# Patient Record
Sex: Female | Born: 1980 | Race: White | Hispanic: No | Marital: Married | Smoking: Current every day smoker
Health system: Southern US, Community
[De-identification: ages and names within clinical notes are randomized; demographics above are authoritative.]

## PROBLEM LIST (undated history)

## (undated) ENCOUNTER — Emergency Department (HOSPITAL_COMMUNITY): Admission: EM | Payer: Self-pay | Source: Home / Self Care

## (undated) DIAGNOSIS — C801 Malignant (primary) neoplasm, unspecified: Secondary | ICD-10-CM

## (undated) DIAGNOSIS — F32A Depression, unspecified: Secondary | ICD-10-CM

## (undated) DIAGNOSIS — F329 Major depressive disorder, single episode, unspecified: Secondary | ICD-10-CM

## (undated) HISTORY — PX: TONSILLECTOMY: SUR1361

## (undated) HISTORY — PX: LIVER RESECTION: SHX1977

## (undated) HISTORY — PX: KNEE SURGERY: SHX244

## (undated) HISTORY — PX: CHOLECYSTECTOMY: SHX55

---

## 2006-01-06 ENCOUNTER — Emergency Department (HOSPITAL_COMMUNITY): Admission: EM | Admit: 2006-01-06 | Discharge: 2006-01-06 | Payer: Self-pay | Admitting: Emergency Medicine

## 2006-01-10 ENCOUNTER — Emergency Department (HOSPITAL_COMMUNITY): Admission: EM | Admit: 2006-01-10 | Discharge: 2006-01-10 | Payer: Self-pay | Admitting: Emergency Medicine

## 2006-03-01 ENCOUNTER — Emergency Department (HOSPITAL_COMMUNITY): Admission: EM | Admit: 2006-03-01 | Discharge: 2006-03-01 | Payer: Self-pay | Admitting: Emergency Medicine

## 2007-12-04 IMAGING — CR DG ELBOW COMPLETE 3+V*L*
4 series · 4 of 4 positions shown · non-contrast
Comparison: none

CLINICAL DATA: Left elbow pain for 2 days.  
 LEFT ELBOW ? 4 VIEW:

[x elbow joint ap left]
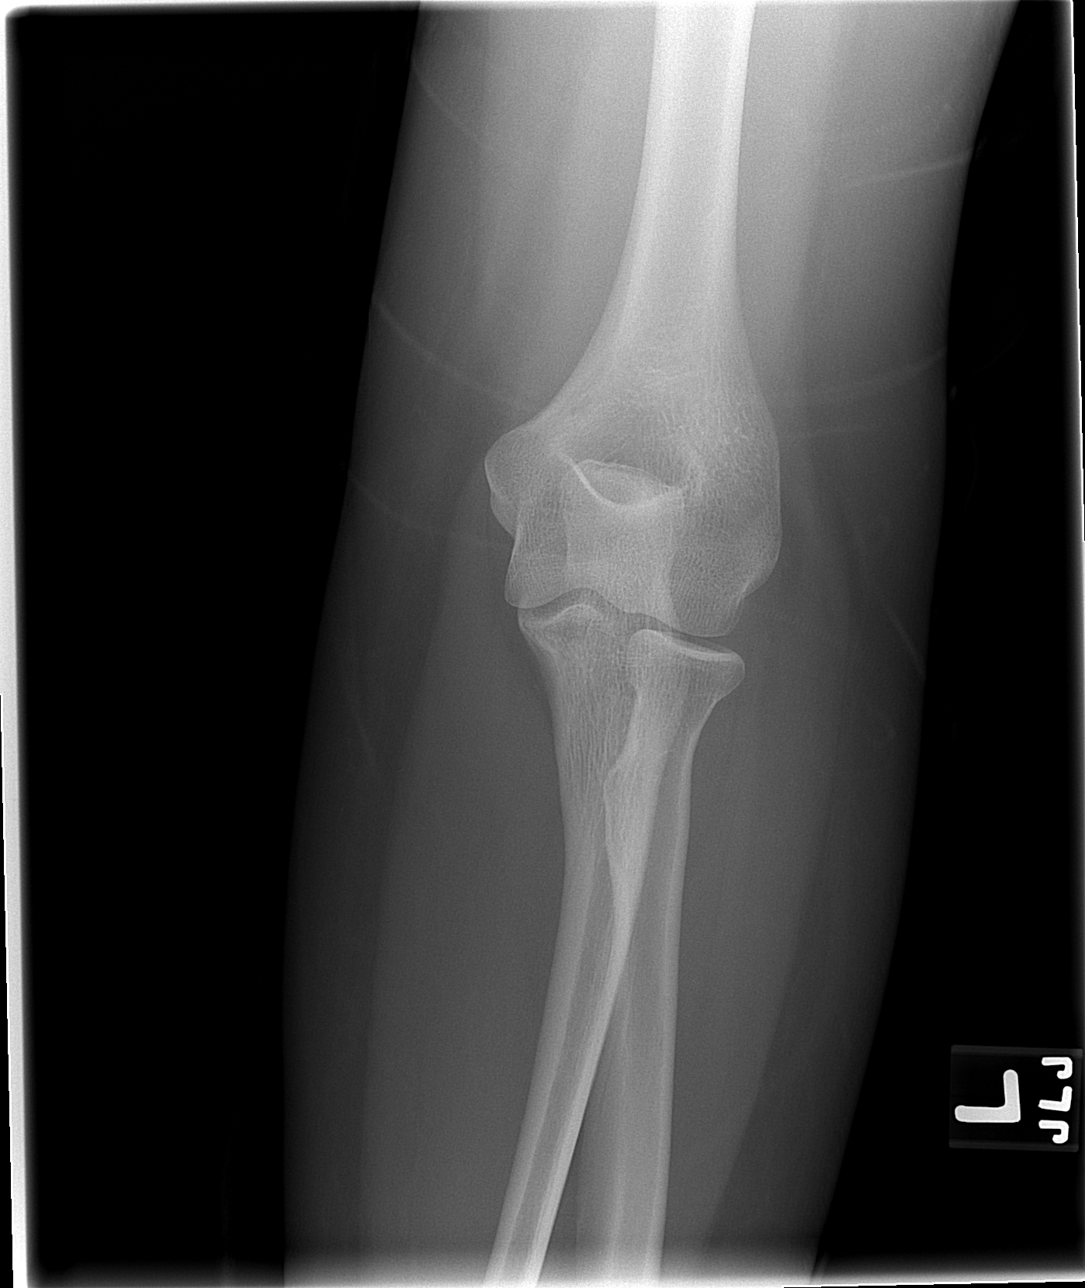

[x elbow joint obl. left (1 of 2)]
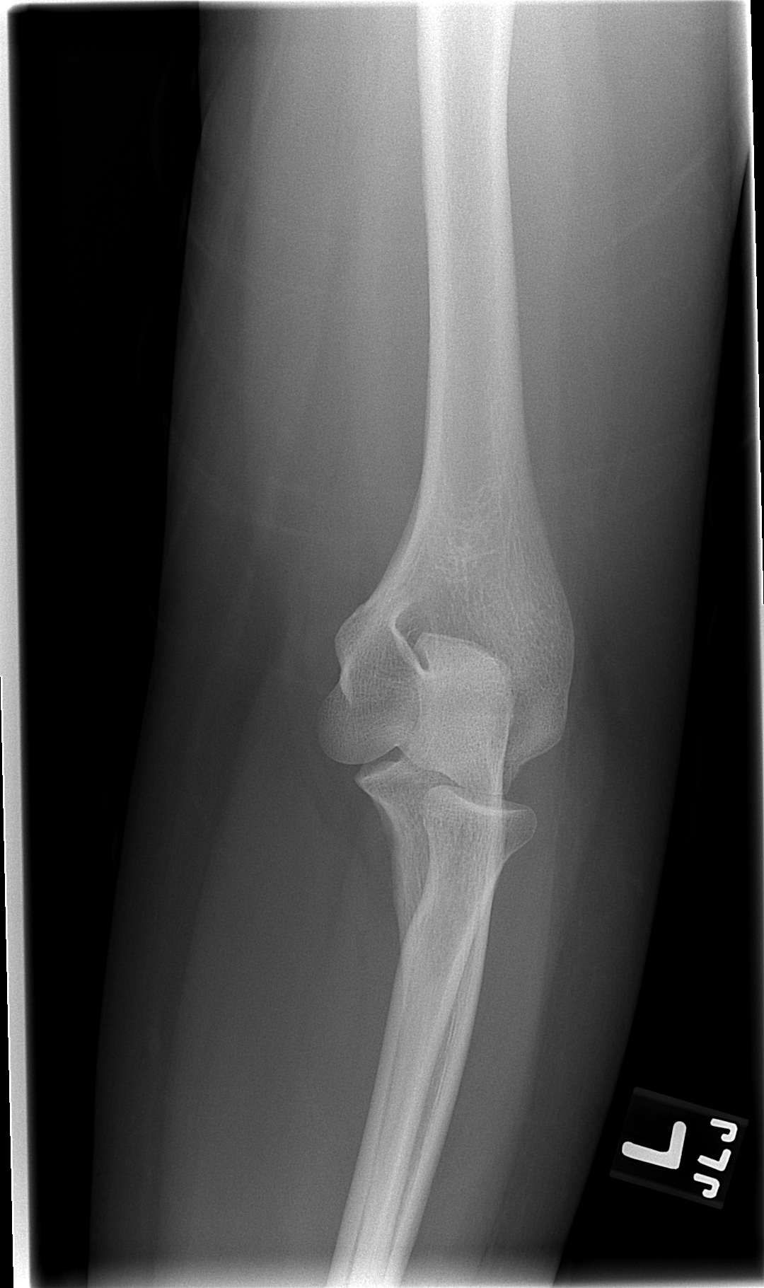

[x elbow joint obl. left (2 of 2)]
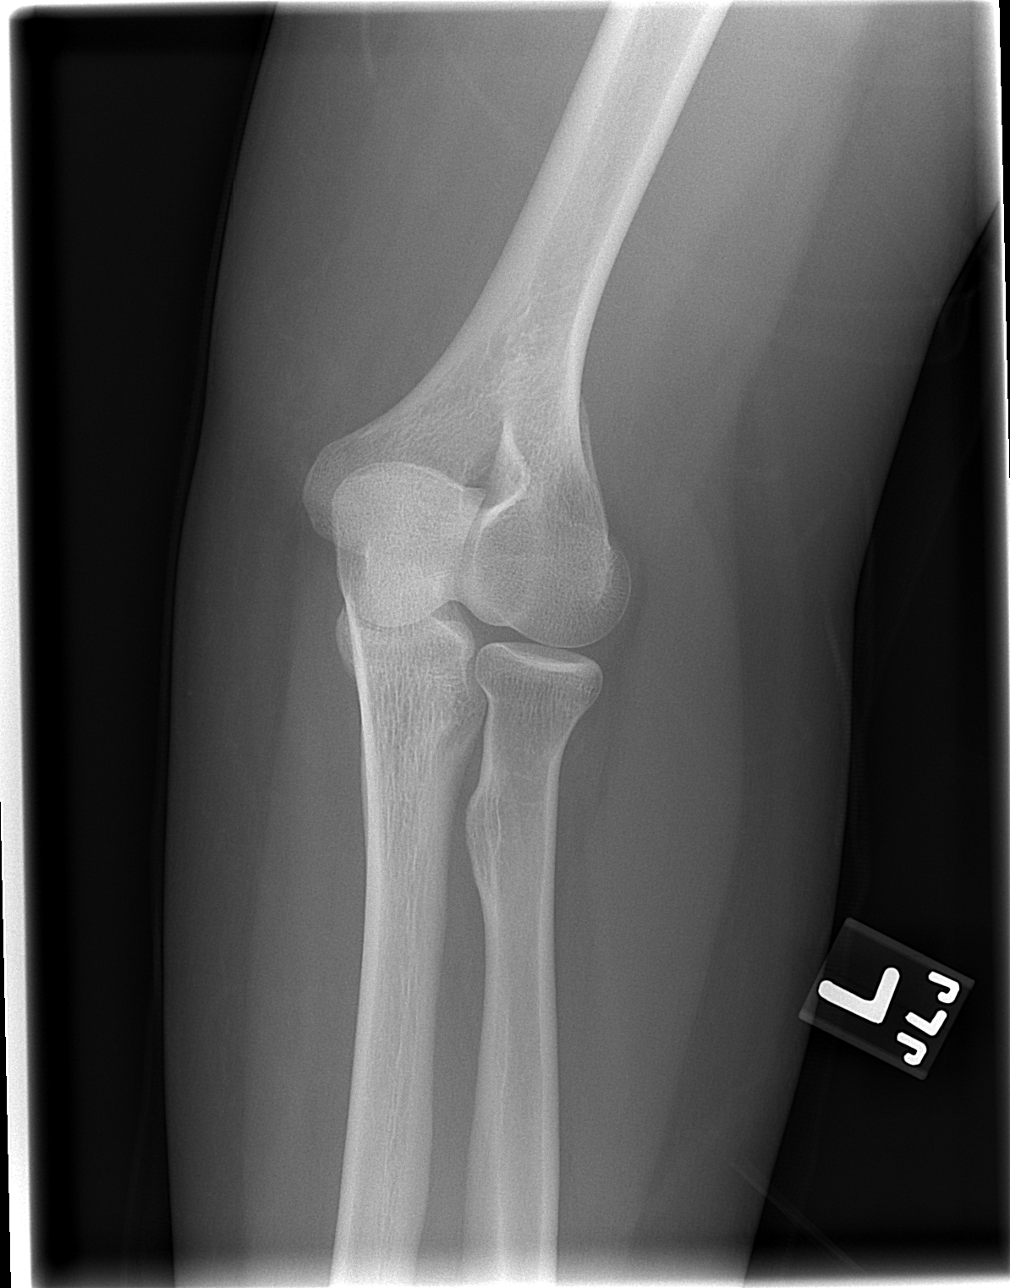

[x elbow joint lat left]
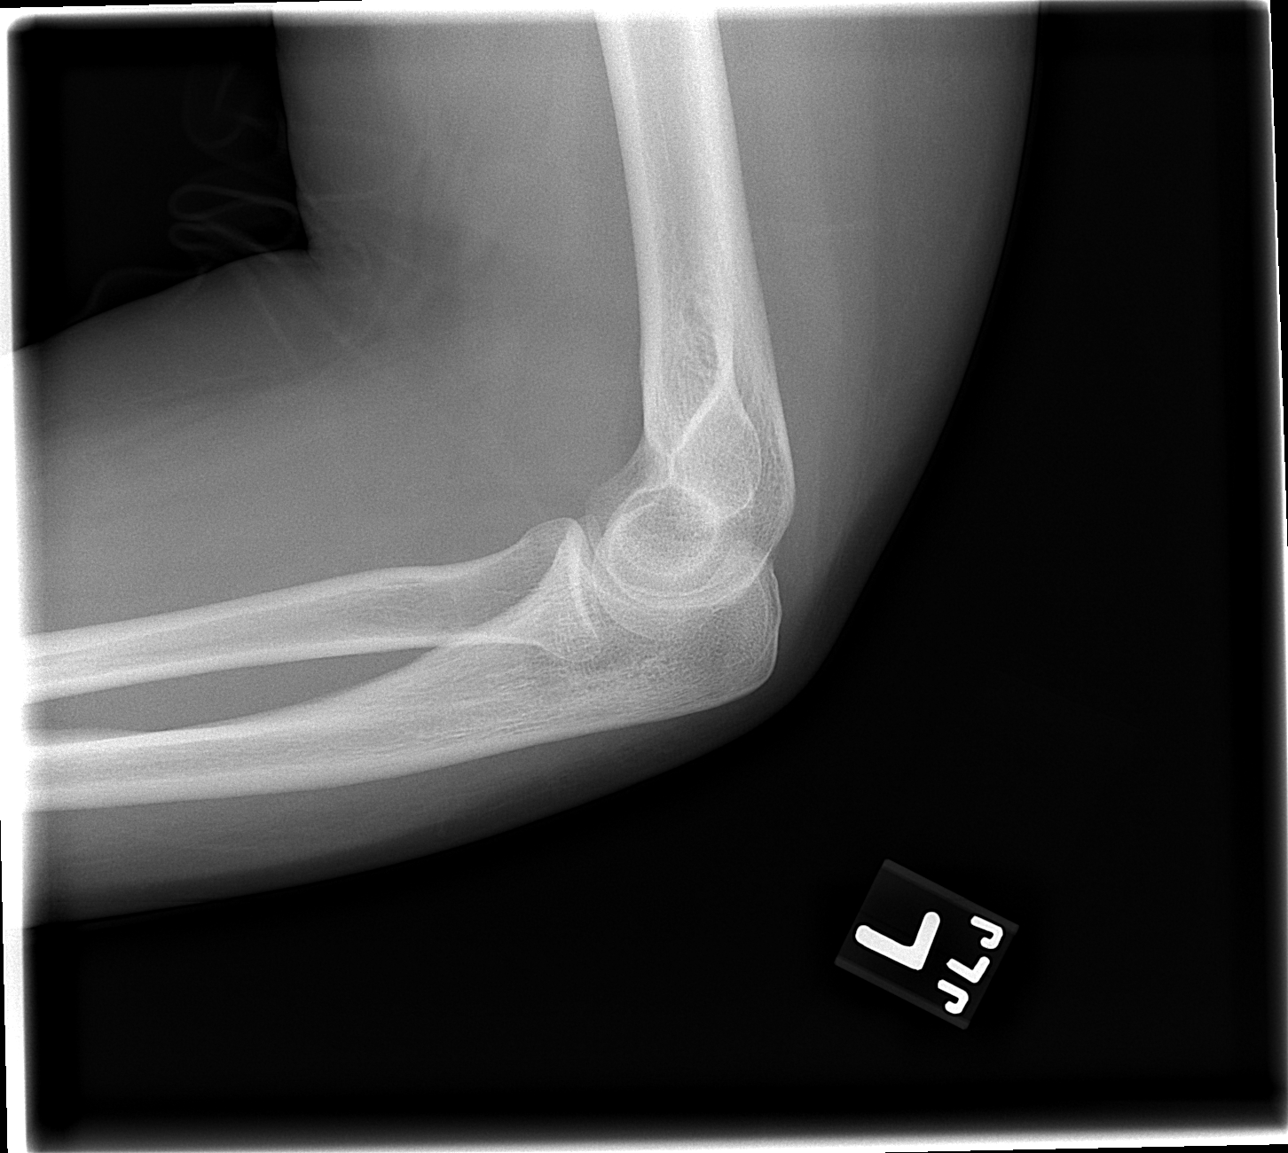

[4 of 4 positions shown; findings below may reference images not displayed]

FINDINGS: No joint effusion.  No bony abnormality.
IMPRESSION: No abnormality.

## 2008-01-27 IMAGING — CR DG CHEST 2V
2 series · 2 of 2 positions shown · non-contrast
Comparison: none

CLINICAL DATA: Mid chest pain.  Short of breath.
 CHEST ? 2 VIEW:

[w chest pa *]
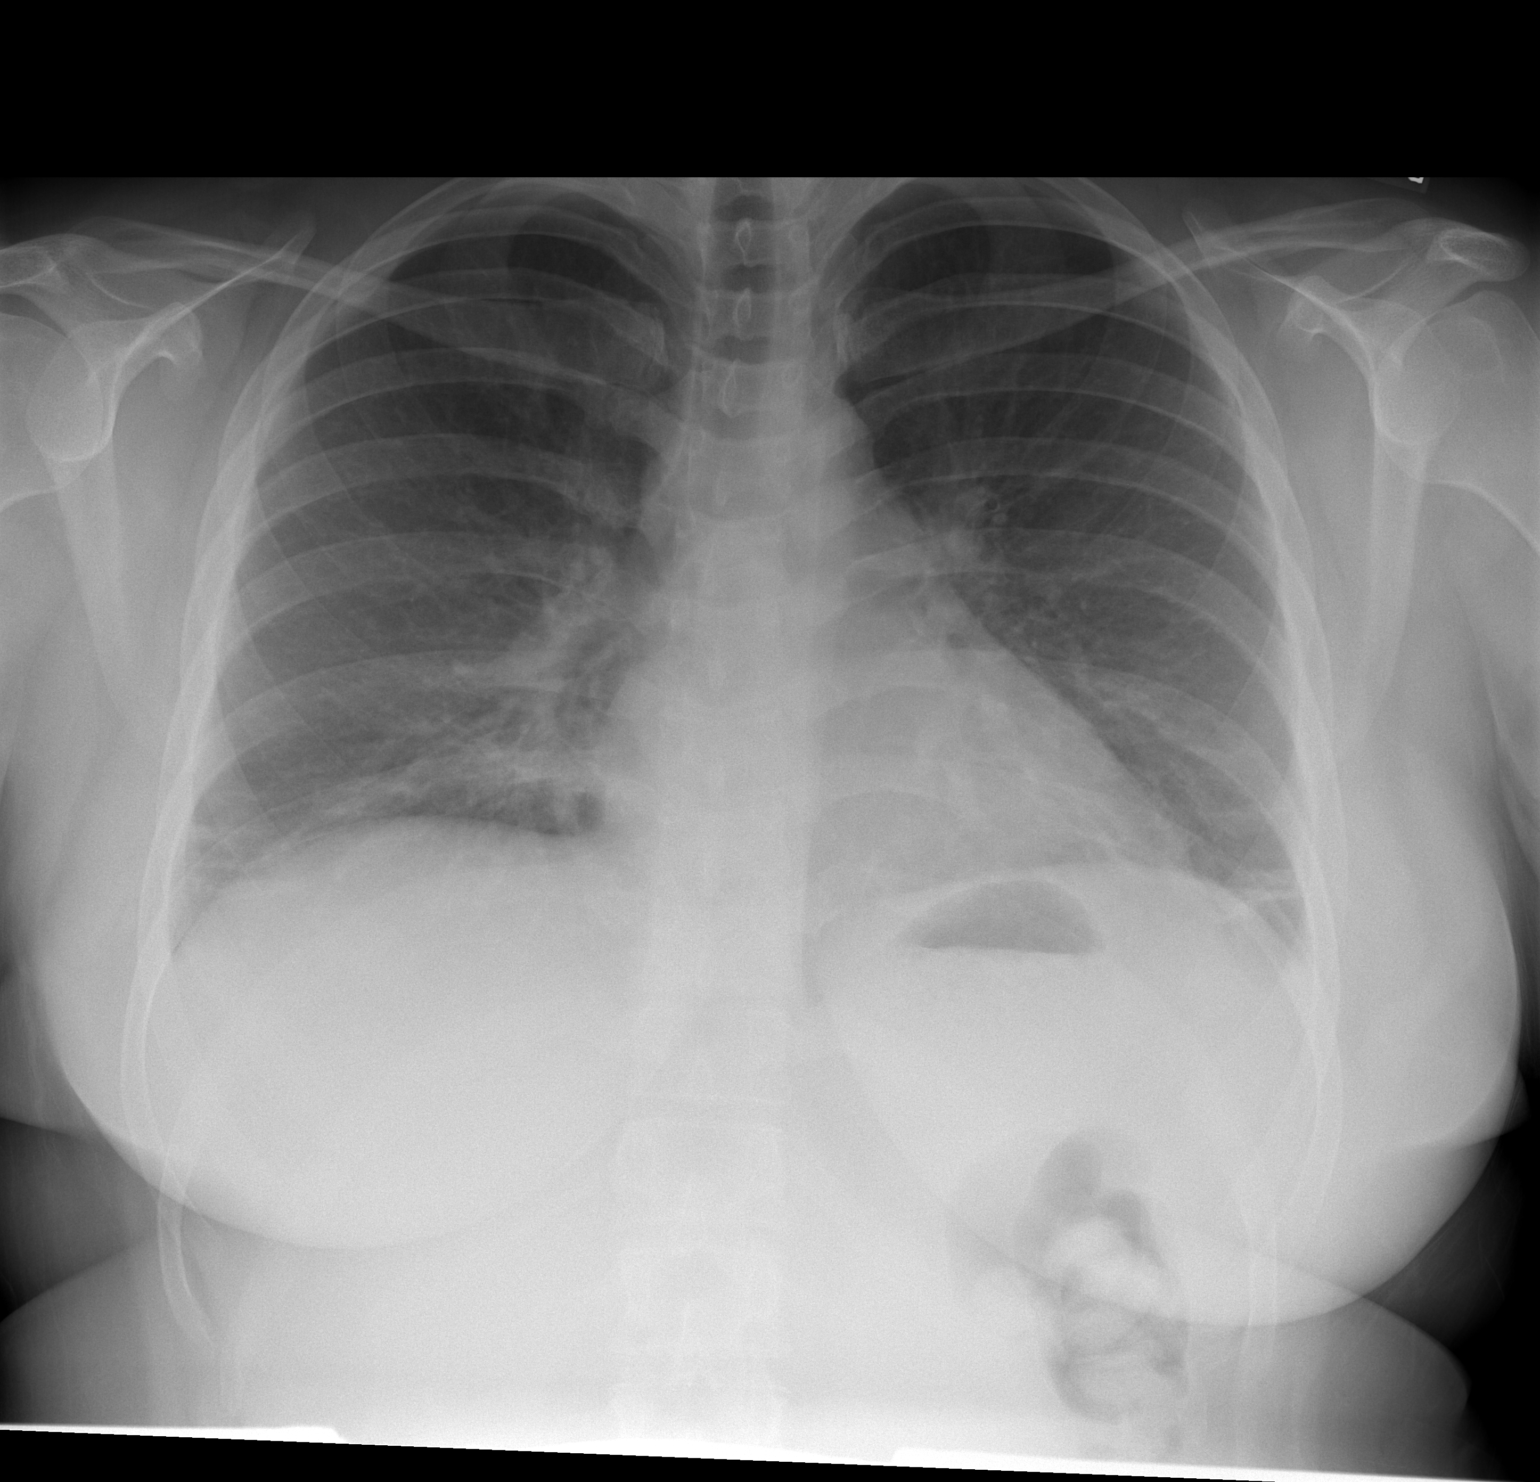

[w chest lat *]
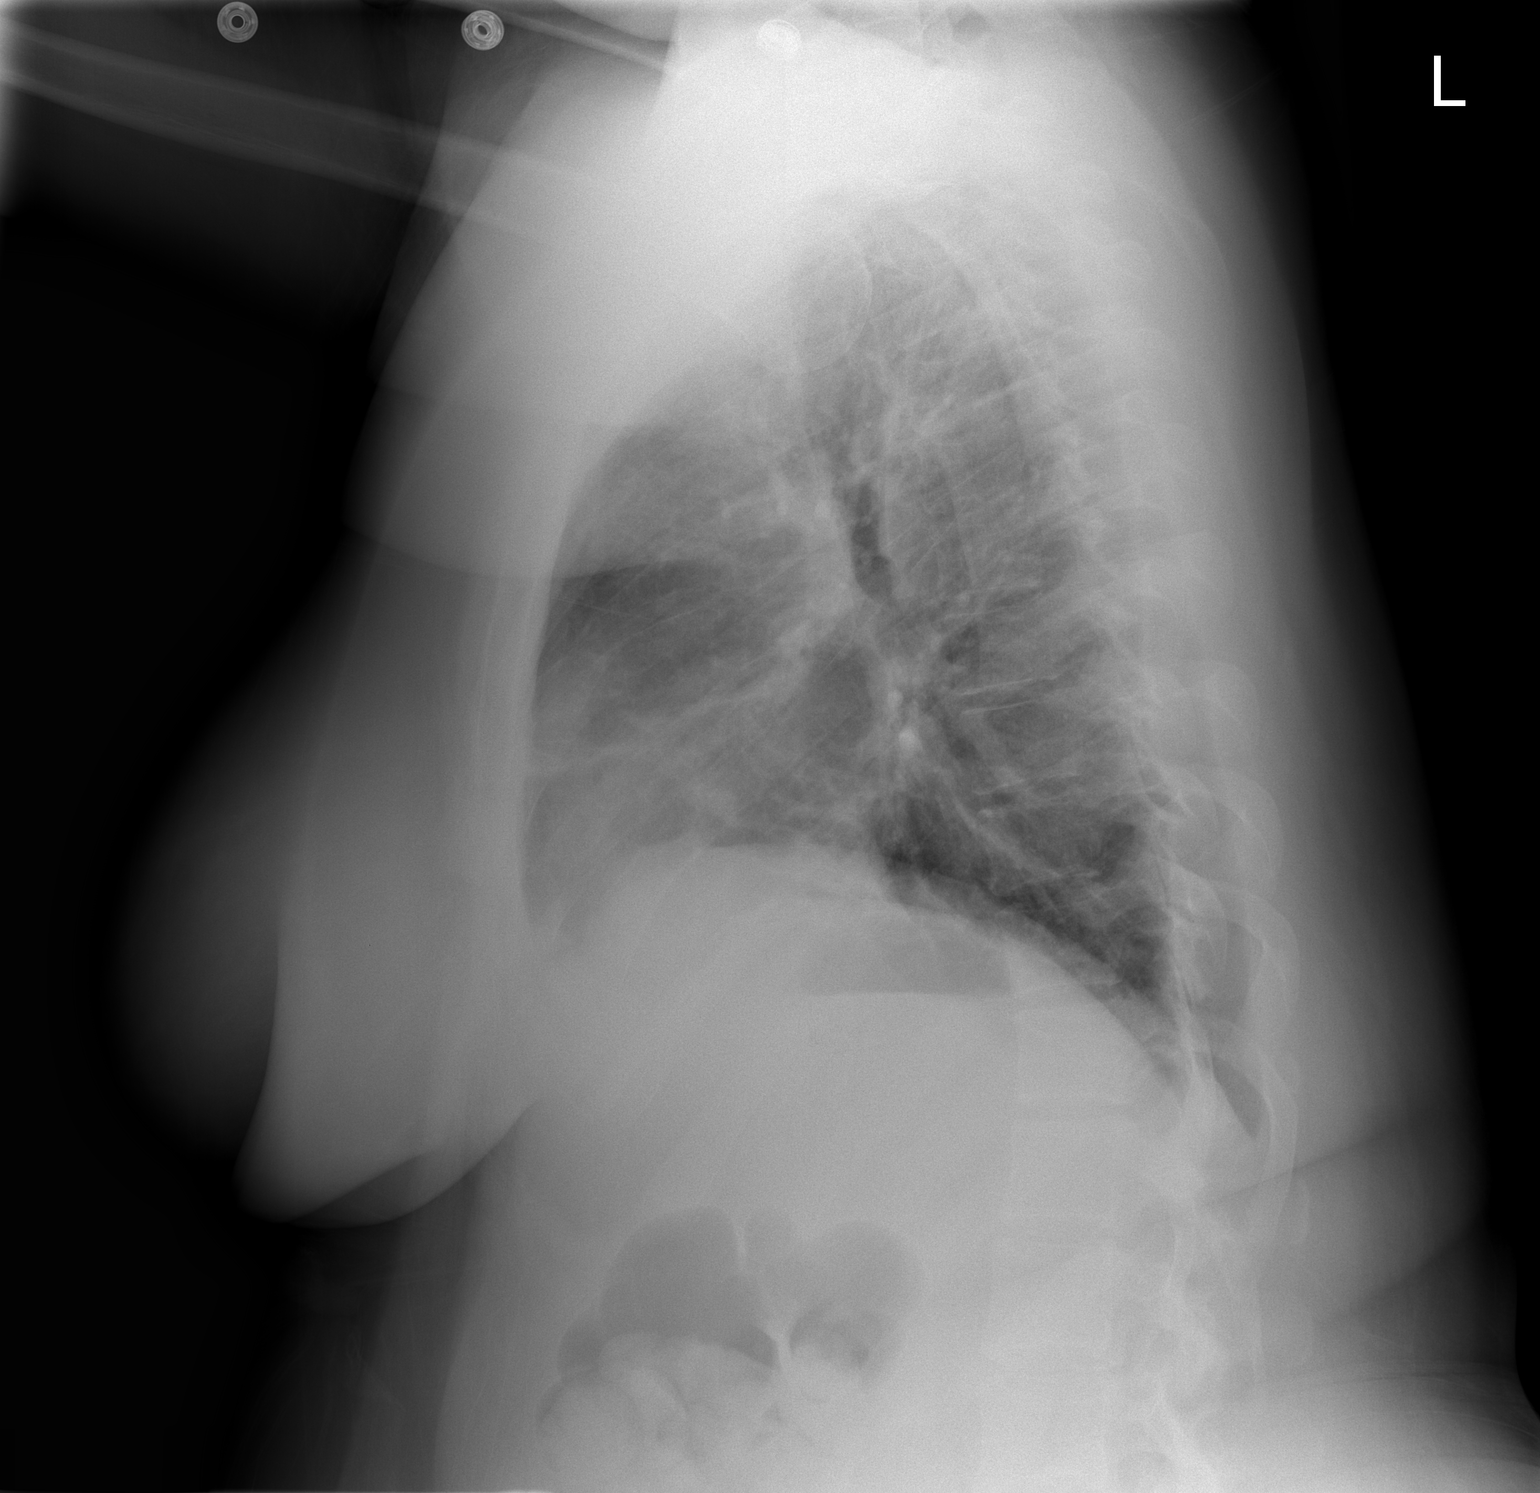

[2 of 2 positions shown; findings below may reference images not displayed]

FINDINGS: Two views of the chest show basilar atelectasis.  Early pneumonia cannot be excluded, and followup is recommended.  No effusion is seen. T he heart is within normal limits in size.
IMPRESSION: Bibasilar linear opacities, probably reflect atelectasis.  Suggest followup to exclude pneumonia.

## 2013-10-05 ENCOUNTER — Encounter (HOSPITAL_COMMUNITY): Payer: Self-pay | Admitting: Emergency Medicine

## 2013-10-05 ENCOUNTER — Emergency Department (HOSPITAL_COMMUNITY)
Admission: EM | Admit: 2013-10-05 | Discharge: 2013-10-06 | Disposition: A | Discharge status: Other Acute Inpatient Hospital | Payer: Medicaid Other | Attending: Emergency Medicine | Admitting: Emergency Medicine

## 2013-10-05 DIAGNOSIS — Z008 Encounter for other general examination: Secondary | ICD-10-CM | POA: Diagnosis present

## 2013-10-05 DIAGNOSIS — F17200 Nicotine dependence, unspecified, uncomplicated: Secondary | ICD-10-CM | POA: Diagnosis not present

## 2013-10-05 DIAGNOSIS — F1510 Other stimulant abuse, uncomplicated: Secondary | ICD-10-CM | POA: Insufficient documentation

## 2013-10-05 DIAGNOSIS — F1210 Cannabis abuse, uncomplicated: Secondary | ICD-10-CM | POA: Insufficient documentation

## 2013-10-05 DIAGNOSIS — R45851 Suicidal ideations: Secondary | ICD-10-CM | POA: Diagnosis not present

## 2013-10-05 DIAGNOSIS — F329 Major depressive disorder, single episode, unspecified: Secondary | ICD-10-CM | POA: Diagnosis not present

## 2013-10-05 DIAGNOSIS — F1310 Sedative, hypnotic or anxiolytic abuse, uncomplicated: Secondary | ICD-10-CM | POA: Insufficient documentation

## 2013-10-05 DIAGNOSIS — Z8505 Personal history of malignant neoplasm of liver: Secondary | ICD-10-CM | POA: Insufficient documentation

## 2013-10-05 DIAGNOSIS — Z5189 Encounter for other specified aftercare: Secondary | ICD-10-CM | POA: Insufficient documentation

## 2013-10-05 DIAGNOSIS — F3289 Other specified depressive episodes: Secondary | ICD-10-CM | POA: Diagnosis not present

## 2013-10-05 DIAGNOSIS — E876 Hypokalemia: Secondary | ICD-10-CM | POA: Insufficient documentation

## 2013-10-05 DIAGNOSIS — N390 Urinary tract infection, site not specified: Secondary | ICD-10-CM | POA: Diagnosis not present

## 2013-10-05 DIAGNOSIS — F1910 Other psychoactive substance abuse, uncomplicated: Secondary | ICD-10-CM

## 2013-10-05 HISTORY — DX: Major depressive disorder, single episode, unspecified: F32.9

## 2013-10-05 HISTORY — DX: Malignant (primary) neoplasm, unspecified: C80.1

## 2013-10-05 HISTORY — DX: Depression, unspecified: F32.A

## 2013-10-05 LAB — BASIC METABOLIC PANEL
ANION GAP: 15 (ref 5–15)
BUN: 7 mg/dL (ref 6–23)
CALCIUM: 10.2 mg/dL (ref 8.4–10.5)
CHLORIDE: 103 meq/L (ref 96–112)
CO2: 24 mEq/L (ref 19–32)
CREATININE: 0.83 mg/dL (ref 0.50–1.10)
Glucose, Bld: 91 mg/dL (ref 70–99)
POTASSIUM: 2.9 meq/L — AB (ref 3.7–5.3)
SODIUM: 142 meq/L (ref 137–147)

## 2013-10-05 LAB — RAPID URINE DRUG SCREEN, HOSP PERFORMED
AMPHETAMINES: POSITIVE — AB
BARBITURATES: NOT DETECTED
BENZODIAZEPINES: POSITIVE — AB
COCAINE: NOT DETECTED
OPIATES: NOT DETECTED
Tetrahydrocannabinol: POSITIVE — AB

## 2013-10-05 LAB — CBC WITH DIFFERENTIAL/PLATELET
Basophils Absolute: 0 10*3/uL (ref 0.0–0.1)
Basophils Relative: 0 % (ref 0–1)
EOS ABS: 0 10*3/uL (ref 0.0–0.7)
Eosinophils Relative: 1 % (ref 0–5)
HEMATOCRIT: 38.4 % (ref 36.0–46.0)
HEMOGLOBIN: 13.1 g/dL (ref 12.0–15.0)
Lymphocytes Relative: 39 % (ref 12–46)
Lymphs Abs: 1.4 10*3/uL (ref 0.7–4.0)
MCH: 28.2 pg (ref 26.0–34.0)
MCHC: 34.1 g/dL (ref 30.0–36.0)
MCV: 82.6 fL (ref 78.0–100.0)
Monocytes Absolute: 0.2 10*3/uL (ref 0.1–1.0)
Monocytes Relative: 6 % (ref 3–12)
NEUTROS ABS: 2 10*3/uL (ref 1.7–7.7)
NEUTROS PCT: 54 % (ref 43–77)
PLATELETS: 238 10*3/uL (ref 150–400)
RBC: 4.65 MIL/uL (ref 3.87–5.11)
RDW: 15 % (ref 11.5–15.5)
WBC: 3.7 10*3/uL — AB (ref 4.0–10.5)

## 2013-10-05 LAB — URINALYSIS, ROUTINE W REFLEX MICROSCOPIC
Glucose, UA: NEGATIVE mg/dL
Ketones, ur: NEGATIVE mg/dL
Nitrite: POSITIVE — AB
PH: 6 (ref 5.0–8.0)
Protein, ur: 30 mg/dL — AB
Specific Gravity, Urine: 1.024 (ref 1.005–1.030)
Urobilinogen, UA: 1 mg/dL (ref 0.0–1.0)

## 2013-10-05 LAB — URINE MICROSCOPIC-ADD ON

## 2013-10-05 LAB — ETHANOL

## 2013-10-05 LAB — SALICYLATE LEVEL

## 2013-10-05 LAB — ACETAMINOPHEN LEVEL

## 2013-10-05 MED ORDER — ACETAMINOPHEN 325 MG PO TABS: 650.0000 mg | ORAL_TABLET | ORAL | Status: DC | PRN

## 2013-10-05 MED ORDER — ZOLPIDEM TARTRATE 5 MG PO TABS: 5.0000 mg | ORAL_TABLET | Freq: Every evening | ORAL | Status: DC | PRN

## 2013-10-05 MED ORDER — NICOTINE 21 MG/24HR TD PT24
21.0000 mg | MEDICATED_PATCH | Freq: Every day | TRANSDERMAL | Status: DC
  Filled 2013-10-05: qty 1

## 2013-10-05 MED ORDER — POTASSIUM CHLORIDE CRYS ER 20 MEQ PO TBCR
40.0000 meq | EXTENDED_RELEASE_TABLET | Freq: Once | ORAL | Status: AC
  Administered 2013-10-05: 40 meq via ORAL
  Filled 2013-10-05: qty 2

## 2013-10-05 MED ORDER — LORAZEPAM 1 MG PO TABS
1.0000 mg | ORAL_TABLET | ORAL | Status: DC | PRN
  Administered 2013-10-05 – 2013-10-06 (×2): 1 mg via ORAL
  Filled 2013-10-05 (×4): qty 1

## 2013-10-05 MED ORDER — POTASSIUM CHLORIDE CRYS ER 20 MEQ PO TBCR
40.0000 meq | EXTENDED_RELEASE_TABLET | Freq: Once | ORAL | Status: AC
  Administered 2013-10-06: 40 meq via ORAL
  Filled 2013-10-05: qty 2

## 2013-10-05 MED ORDER — ONDANSETRON HCL 4 MG PO TABS: 4.0000 mg | ORAL_TABLET | Freq: Three times a day (TID) | ORAL | Status: DC | PRN

## 2013-10-05 NOTE — ED Notes (Addendum)
Pt has in belonging bag:  Brown sandals, silver plated hoops earrings, white undershirt, blue undershirt, white bra, brown shorts, white necklaces, brown belt, white lace underwear, pt has three bags:  Pink and black bag with clothes, two light brown bags with clothes, one pink bag

## 2013-10-05 NOTE — ED Provider Notes (Signed)
CSN: [id number]     Arrival date & time 10/05/13  1616 History   First MD Initiated Contact with Patient 10/05/13 2032     Chief Complaint  Patient presents with  . detox    HPI  Patient is a 33 y.o. Female who presents to the ED with desire for detox from opiates and suicidal ideations.  Patient states that she has been using opiates daily for the past three months.  She prefers to use "pills" like opana and roxicodon.  Patient states that she also uses heroin which she injects into her neck and also her hands.  Patient states that she also occasionally uses crystal meth and speed.  She reports last using today.  Patient states that she has been detoxing in the ED waiting room here.  Patient states that she also has suicidal ideations for the past couple months.  Patient states that she has thought about intermittently killing herself by OD.  She has a history of cutting in the past.  Patient states that has a history of bipolar and depression and the patient was supposed to be taking medication for but has not taken for the past 3 months.  Patient is requesting help with detox at this time.  She wants to be able to take care of her child.  She complains of nausea, vomiting, diarrhea, chills.  She has no other complaints at this time.  Past Medical History  Diagnosis Date  . Cancer liver   Past Surgical History  Procedure Laterality Date  . Cholecystectomy    . Knee surgery Right   . Tonsillectomy    . Liver resection     No family history on file. History  Substance Use Topics  . Smoking status: Never Smoker   . Smokeless tobacco: Not on file  . Alcohol Use: No   OB History   Grav Para Term Preterm Abortions TAB SAB Ect Mult Living                 Review of Systems  See HPI  Allergies  Toradol and Tramadol  Home Medications   Prior to Admission medications   Medication Sig Start Date End Date Taking? Authorizing Provider  ALPRAZolam Duanne Moron) 0.5 MG tablet Take 0.5 mg by  mouth 4 (four) times daily.   Yes Historical Provider, MD  lithium carbonate (ESKALITH) 450 MG CR tablet Take 450 mg by mouth 2 (two) times daily.   Yes Historical Provider, MD  PARoxetine (PAXIL) 30 MG tablet Take 30 mg by mouth daily.   Yes Historical Provider, MD   BP 136/82  Pulse 94  Temp(Src) 98.1 F (36.7 C) (Oral)  Resp 18  SpO2 100%  LMP 09/29/2013 Physical Exam  Nursing note and vitals reviewed. Constitutional: She is oriented to person, place, and time. She appears well-developed and well-nourished. No distress.  HENT:  Head: Normocephalic and atraumatic.  Mouth/Throat: Oropharynx is clear and moist. No oropharyngeal exudate.  Eyes: Conjunctivae and EOM are normal. Pupils are equal, round, and reactive to light. No scleral icterus.  Neck: Normal range of motion. Neck supple. No JVD present. No thyromegaly present.  Cardiovascular: Normal rate, regular rhythm and intact distal pulses.  Exam reveals no gallop and no friction rub.   No murmur heard. Pulmonary/Chest: Effort normal and breath sounds normal. No respiratory distress. She has no wheezes. She has no rales. She exhibits no tenderness.  Abdominal: Soft. Bowel sounds are normal. She exhibits no distension and no mass. There  is no tenderness. There is no rebound and no guarding.  Musculoskeletal: Normal range of motion.  Lymphadenopathy:    She has no cervical adenopathy.  Neurological: She is alert and oriented to person, place, and time.  Skin: Skin is warm and dry. She is not diaphoretic.  Psychiatric: Her speech is normal and behavior is normal. Judgment normal. She exhibits a depressed mood. She expresses suicidal ideation. She expresses no homicidal ideation. She expresses suicidal plans. She expresses no homicidal plans.    ED Course  Procedures (including critical care time) Labs Review Labs Reviewed  CBC WITH DIFFERENTIAL - Abnormal; Notable for the following:    WBC 3.7 (*)    All other components within  normal limits  BASIC METABOLIC PANEL - Abnormal; Notable for the following:    Potassium 2.9 (*)    All other components within normal limits  URINALYSIS, ROUTINE W REFLEX MICROSCOPIC - Abnormal; Notable for the following:    Color, Urine AMBER (*)    APPearance CLOUDY (*)    Hgb urine dipstick LARGE (*)    Bilirubin Urine SMALL (*)    Protein, ur 30 (*)    Nitrite POSITIVE (*)    Leukocytes, UA SMALL (*)    All other components within normal limits  URINE RAPID DRUG SCREEN (HOSP PERFORMED) - Abnormal; Notable for the following:    Benzodiazepines POSITIVE (*)    Amphetamines POSITIVE (*)    Tetrahydrocannabinol POSITIVE (*)    All other components within normal limits  SALICYLATE LEVEL - Abnormal; Notable for the following:    Salicylate Lvl <6.2 (*)    All other components within normal limits  URINE MICROSCOPIC-ADD ON - Abnormal; Notable for the following:    Squamous Epithelial / LPF MANY (*)    Bacteria, UA MANY (*)    Crystals CA OXALATE CRYSTALS (*)    All other components within normal limits  ETHANOL  ACETAMINOPHEN LEVEL    Imaging Review No results found.   EKG Interpretation None      MDM   Final diagnoses:  Polysubstance abuse  UTI (lower urinary tract infection)  Hypokalemia  Suicidal ideations   Patient is a 33 y.o. Female who presents to the ED with multiple complaints of drug detox and also for suicidal ideations.  Patient has had basic labs drawn for psych hold here at this time.  UDS shows that she is benzo, THC, and amphetamines at this time.  UA shows UTI.  BMP shows hypokalemia which was treated here in the ED.  CBC is without abnormality at this time.  TTS was consulted at this time.  Dr. Ashok Cordia was spoken to about this patient.  Currently awaiting psych input at this time. Patient will need to be discharged with a prescription for keflex for UTI.      Cherylann Parr, PA-C 10/05/13 2351

## 2013-10-05 NOTE — ED Notes (Signed)
Pt states "I want detox from opiates and speed". Denies SI/HI

## 2013-10-05 NOTE — ED Notes (Addendum)
Patient reports SI. States that she is worried she will be sent home. States she does not feel safe going home at present. Denies HI, AVH. Reports feelings of anxiety 8/10, depression 10/10. Patient states that she has trouble falling and staying asleep. States that she has been loosing weight, approximately 15 lbs in the last month.   Encouragement offered. Given Ativan.  Q 15 safety checks in place.

## 2013-10-06 ENCOUNTER — Encounter (HOSPITAL_COMMUNITY): Payer: Self-pay | Admitting: Behavioral Health

## 2013-10-06 ENCOUNTER — Encounter (HOSPITAL_COMMUNITY): Payer: Self-pay | Admitting: *Deleted

## 2013-10-06 ENCOUNTER — Inpatient Hospital Stay (HOSPITAL_COMMUNITY)
Admission: AD | Admit: 2013-10-06 | Discharge: 2013-10-11 | DRG: 885 | Disposition: A | Discharge status: Home / Self Care | Payer: Medicaid Other | Source: Intra-hospital | Attending: Psychiatry | Admitting: Psychiatry

## 2013-10-06 DIAGNOSIS — F314 Bipolar disorder, current episode depressed, severe, without psychotic features: Secondary | ICD-10-CM

## 2013-10-06 DIAGNOSIS — F411 Generalized anxiety disorder: Secondary | ICD-10-CM | POA: Diagnosis present

## 2013-10-06 DIAGNOSIS — G4700 Insomnia, unspecified: Secondary | ICD-10-CM | POA: Diagnosis present

## 2013-10-06 DIAGNOSIS — F1994 Other psychoactive substance use, unspecified with psychoactive substance-induced mood disorder: Secondary | ICD-10-CM | POA: Diagnosis present

## 2013-10-06 DIAGNOSIS — F1510 Other stimulant abuse, uncomplicated: Secondary | ICD-10-CM | POA: Diagnosis present

## 2013-10-06 DIAGNOSIS — F1120 Opioid dependence, uncomplicated: Secondary | ICD-10-CM | POA: Diagnosis present

## 2013-10-06 DIAGNOSIS — C228 Malignant neoplasm of liver, primary, unspecified as to type: Secondary | ICD-10-CM | POA: Diagnosis present

## 2013-10-06 DIAGNOSIS — F3130 Bipolar disorder, current episode depressed, mild or moderate severity, unspecified: Secondary | ICD-10-CM | POA: Diagnosis present

## 2013-10-06 DIAGNOSIS — F410 Panic disorder [episodic paroxysmal anxiety] without agoraphobia: Secondary | ICD-10-CM | POA: Diagnosis present

## 2013-10-06 DIAGNOSIS — Z808 Family history of malignant neoplasm of other organs or systems: Secondary | ICD-10-CM

## 2013-10-06 DIAGNOSIS — F1123 Opioid dependence with withdrawal: Secondary | ICD-10-CM

## 2013-10-06 DIAGNOSIS — F1310 Sedative, hypnotic or anxiolytic abuse, uncomplicated: Secondary | ICD-10-CM | POA: Diagnosis not present

## 2013-10-06 DIAGNOSIS — F4310 Post-traumatic stress disorder, unspecified: Secondary | ICD-10-CM | POA: Diagnosis present

## 2013-10-06 DIAGNOSIS — F17200 Nicotine dependence, unspecified, uncomplicated: Secondary | ICD-10-CM | POA: Diagnosis present

## 2013-10-06 DIAGNOSIS — R45851 Suicidal ideations: Secondary | ICD-10-CM

## 2013-10-06 LAB — POTASSIUM: Potassium: 4.3 mEq/L (ref 3.7–5.3)

## 2013-10-06 MED ORDER — ONDANSETRON HCL 4 MG PO TABS: 4.0000 mg | ORAL_TABLET | Freq: Three times a day (TID) | ORAL | Status: DC | PRN

## 2013-10-06 MED ORDER — ALUM & MAG HYDROXIDE-SIMETH 200-200-20 MG/5ML PO SUSP: 30.0000 mL | ORAL | Status: DC | PRN

## 2013-10-06 MED ORDER — CLONIDINE HCL 0.1 MG PO TABS
0.1000 mg | ORAL_TABLET | Freq: Every day | ORAL | Status: AC
  Administered 2013-10-08 – 2013-10-10 (×2): 0.1 mg via ORAL
  Filled 2013-10-06 (×2): qty 1

## 2013-10-06 MED ORDER — PAROXETINE HCL 30 MG PO TABS: 30.0000 mg | ORAL_TABLET | Freq: Every day | ORAL | Status: DC

## 2013-10-06 MED ORDER — ONDANSETRON 4 MG PO TBDP
4.0000 mg | ORAL_TABLET | Freq: Four times a day (QID) | ORAL | Status: AC | PRN
  Administered 2013-10-07 – 2013-10-10 (×5): 4 mg via ORAL
  Filled 2013-10-06 (×6): qty 1

## 2013-10-06 MED ORDER — METHOCARBAMOL 500 MG PO TABS
500.0000 mg | ORAL_TABLET | Freq: Three times a day (TID) | ORAL | Status: AC | PRN
  Administered 2013-10-07 – 2013-10-11 (×9): 500 mg via ORAL
  Filled 2013-10-06 (×9): qty 1

## 2013-10-06 MED ORDER — DICYCLOMINE HCL 20 MG PO TABS
20.0000 mg | ORAL_TABLET | Freq: Four times a day (QID) | ORAL | Status: AC | PRN
  Administered 2013-10-08 – 2013-10-10 (×2): 20 mg via ORAL
  Filled 2013-10-06 (×2): qty 1

## 2013-10-06 MED ORDER — ACETAMINOPHEN 325 MG PO TABS: 650.0000 mg | ORAL_TABLET | ORAL | Status: DC | PRN

## 2013-10-06 MED ORDER — CLONIDINE HCL 0.1 MG PO TABS
0.1000 mg | ORAL_TABLET | ORAL | Status: AC
  Administered 2013-10-08 – 2013-10-09 (×4): 0.1 mg via ORAL
  Filled 2013-10-06 (×4): qty 1

## 2013-10-06 MED ORDER — NICOTINE 21 MG/24HR TD PT24
21.0000 mg | MEDICATED_PATCH | Freq: Every day | TRANSDERMAL | Status: DC
  Administered 2013-10-07 – 2013-10-11 (×5): 21 mg via TRANSDERMAL
  Filled 2013-10-06 (×8): qty 1

## 2013-10-06 MED ORDER — POTASSIUM CHLORIDE CRYS ER 20 MEQ PO TBCR
40.0000 meq | EXTENDED_RELEASE_TABLET | Freq: Once | ORAL | Status: AC
  Administered 2013-10-06: 40 meq via ORAL
  Filled 2013-10-06: qty 2

## 2013-10-06 MED ORDER — ACETAMINOPHEN 325 MG PO TABS: 650.0000 mg | ORAL_TABLET | Freq: Four times a day (QID) | ORAL | Status: DC | PRN

## 2013-10-06 MED ORDER — HYDROXYZINE HCL 25 MG PO TABS
25.0000 mg | ORAL_TABLET | Freq: Four times a day (QID) | ORAL | Status: AC | PRN
  Administered 2013-10-10: 25 mg via ORAL
  Filled 2013-10-06: qty 1

## 2013-10-06 MED ORDER — CLONIDINE HCL 0.1 MG PO TABS
0.1000 mg | ORAL_TABLET | Freq: Four times a day (QID) | ORAL | Status: AC
  Administered 2013-10-06 – 2013-10-07 (×7): 0.1 mg via ORAL
  Filled 2013-10-06 (×8): qty 1

## 2013-10-06 MED ORDER — LORAZEPAM 1 MG PO TABS
1.0000 mg | ORAL_TABLET | ORAL | Status: DC | PRN
  Administered 2013-10-06 – 2013-10-11 (×19): 1 mg via ORAL
  Filled 2013-10-06 (×19): qty 1

## 2013-10-06 MED ORDER — ZOLPIDEM TARTRATE 5 MG PO TABS: 5.0000 mg | ORAL_TABLET | Freq: Every evening | ORAL | Status: DC | PRN

## 2013-10-06 MED ORDER — POTASSIUM CHLORIDE CRYS ER 20 MEQ PO TBCR
20.0000 meq | EXTENDED_RELEASE_TABLET | Freq: Two times a day (BID) | ORAL | Status: AC
  Administered 2013-10-06 – 2013-10-09 (×6): 20 meq via ORAL
  Filled 2013-10-06 (×6): qty 1

## 2013-10-06 MED ORDER — MAGNESIUM HYDROXIDE 400 MG/5ML PO SUSP: 30.0000 mL | Freq: Every day | ORAL | Status: DC | PRN

## 2013-10-06 MED ORDER — LOPERAMIDE HCL 2 MG PO CAPS: 2.0000 mg | ORAL_CAPSULE | ORAL | Status: AC | PRN

## 2013-10-06 MED ORDER — ZOLPIDEM TARTRATE 10 MG PO TABS
10.0000 mg | ORAL_TABLET | Freq: Every evening | ORAL | Status: DC | PRN
  Administered 2013-10-06 – 2013-10-07 (×2): 10 mg via ORAL
  Filled 2013-10-06 (×2): qty 1

## 2013-10-06 MED ORDER — NAPROXEN 500 MG PO TABS
500.0000 mg | ORAL_TABLET | Freq: Two times a day (BID) | ORAL | Status: AC | PRN
  Administered 2013-10-08 – 2013-10-10 (×2): 500 mg via ORAL
  Filled 2013-10-06 (×2): qty 1

## 2013-10-06 MED ORDER — LITHIUM CARBONATE ER 450 MG PO TBCR: 450.0000 mg | EXTENDED_RELEASE_TABLET | Freq: Two times a day (BID) | ORAL | Status: DC

## 2013-10-06 NOTE — Progress Notes (Signed)
D: Patient in the hallway on approach.  Patient states that she had a good day.  Patient states she is having withdrawal symptoms.  Patient has been cooperative an pleasant tonight.  Patient states she is passive SI but verbally contracts for safety.  Patient denies HI and denies AVH.   A: Staff to monitor Q 15 mins for safety.  Encouragement and support offered.  Scheduled medications administered per orders.  Bandage applied to do bite on right leg. R: Patient remains safe on the unit.  Patient attended group tonight.  Patient visible on the unit.  Patient taking administered medications.

## 2013-10-06 NOTE — [facility] Group Notes (Signed)
Adult Psychoeducational Group Note  Date:  10/06/2013 Time:  9:19 PM  Pt attended N/A group this evening. Pt participated and shared with the group.  Lavinia Sharps P 10/06/2013, 9:19 PM

## 2013-10-06 NOTE — ED Provider Notes (Signed)
Medical screening examination/treatment/procedure(s) were conducted as a shared visit with non-physician practitioner(s) and myself.  I personally evaluated the patient during the encounter.  Pt with opiate abuse, also notes depression and SI.  Alert, content, normal appearing mood/affect. Labs. Psych team consulted. dispo per psych team.     Mirna Mires, MD 10/06/13 [phone]

## 2013-10-06 NOTE — [facility] Suicide Risk Assessment (Signed)
Suicide Risk Assessment  Admission Assessment     Nursing information obtained from:    Demographic factors:    Current Mental Status:    Loss Factors:    Historical Factors:    Risk Reduction Factors:    Total Time spent with patient: 45 minutes  CLINICAL FACTORS:   Depression:   Comorbid alcohol abuse/dependence Alcohol/Substance Abuse/Dependencies COGNITIVE FEATURES THAT CONTRIBUTE TO RISK:  Closed-mindedness Polarized thinking Thought constriction (tunnel vision)    SUICIDE RISK:   Moderate:  Frequent suicidal ideation with limited intensity, and duration, some specificity in terms of plans, no associated intent, good self-control, limited dysphoria/symptomatology, some risk factors present, and identifiable protective factors, including available and accessible social support.  PLAN OF CARE: Supportive approach/coping skills/relapse prevention                               Detox with clonidine                               Reassess and address the co morbities  I certify that inpatient services furnished can reasonably be expected to improve the patient's condition.  Brandy Wise A 10/06/2013, 2:53 PM
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
## 2013-10-06 NOTE — Tx Team (Signed)
Initial Interdisciplinary Treatment Plan   PATIENT STRESSORS: Health problems Legal issue Substance abuse   PROBLEM LIST: Problem List/Patient Goals Date to be addressed Date deferred Reason deferred Estimated date of resolution  Depression 10/06/13     Anxiety 10/06/13     Suicidal Ideation 10/06/13                                          DISCHARGE CRITERIA:  Ability to meet basic life and health needs Improved stabilization in mood, thinking, and/or behavior Motivation to continue treatment in a less acute level of care  PRELIMINARY DISCHARGE PLAN: Attend aftercare/continuing care group Attend 12-step recovery group Outpatient therapy  PATIENT/FAMIILY INVOLVEMENT: This treatment plan has been presented to and reviewed with the patient, Brandy Wise.  The patient and family have been given the opportunity to ask questions and make suggestions.  Eduard Roux E 10/06/2013, 12:54 PM
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
## 2013-10-06 NOTE — Progress Notes (Signed)
Admission Note  D: Patient admitted to [facility] from [facility]. Patient presents with flat, blunted affect and depressed, irritable mood. She voiced that she's been depressed for over 15 years and also been battling with substance abuse. Patient reported that she would like to detox off of opiates.   A: Support and encouragement provided to patient. Oriented patient to the unit and informed of the hospital's rules/policies. Initiated Q15 minute checks for safety.  R: Patient receptive. Endorses SI, but contracts for safety. Denies HI and AVH. Patient remains safe on the unit.

## 2013-10-07 DIAGNOSIS — F1994 Other psychoactive substance use, unspecified with psychoactive substance-induced mood disorder: Secondary | ICD-10-CM

## 2013-10-07 DIAGNOSIS — F1910 Other psychoactive substance abuse, uncomplicated: Secondary | ICD-10-CM

## 2013-10-07 NOTE — BHH Group Notes (Signed)
[place] LCSW Group Therapy  10/07/2013 3:10 PM  Type of Therapy:  Group Therapy  Participation Level:  Did Not Attend-pt in room sleeping.   Smart, Bryanna Yim LCSWA  10/07/2013, 3:10 PM

## 2013-10-07 NOTE — Clinical Social Work Note (Addendum)
CSW faxed sent referral to [facility]. Called to confirm receipt of referral.  CSW left voicemail for probation officer Nichola Sizer, awaiting return call.  CSW faxed letter to [facility]s office per patient's request regarding her court appearance this morning.  Tilden Fossa, MSW, [place] Worker [facility] [phone]

## 2013-10-07 NOTE — Clinical Social Work Note (Signed)
Per Lenna Sciara, intake RN at [facility]: Patient declined at [facility] due to pending legal issues.  Tilden Fossa, MSW, [place] Worker [facility] [phone]

## 2013-10-07 NOTE — Progress Notes (Signed)
Adult Psychoeducational Group Note  Date:  10/07/2013 Time:  10:11 PM  Group Topic/Focus:  Wrap-Up Group:   The focus of this group is to help patients review their daily goal of treatment and discuss progress on daily workbooks.  Participation Level:  Active  Participation Quality:  Appropriate  Affect:  Appropriate  Cognitive:  Alert and Oriented  Insight: Appropriate  Engagement in Group:  Developing/Improving  Modes of Intervention:  Exploration and Support  Additional Comments:  Patient rated her day between a 2 and 4. Patient stated that this has been one of her roughest days since she has been here. Patient stated that one positive was that she was able to speak with her husband. Patient stated that two coping skills that she can use are writing and using her journal. Patient stated that tomorrow she would improve her day by being more sociable.  Lisbet Busker, Patrick North 10/07/2013, 10:11 PM

## 2013-10-07 NOTE — Progress Notes (Signed)
D: Pt presents flat in affect with an anxious and depressed mood. Pt is currently endorsing anxiety, sweats, nausea, shakiness, and body aches in relation to her withdrawal symptoms. Pt is currently passive for SI. Pt verbally contracts for safety. Pt is hoping to be discharged directly to a Rehab facility. Pt reports that she may relapse if she is not directly discharged to one.  A: Writer administered scheduled and prn medications to pt. Continued support and availably as needed was extended to this pt. Staff continue to monitor pt with q23min checks.  R: No adverse drug reactions noted. Pt receptive to treatment. Pt remains safe at this time.

## 2013-10-07 NOTE — Clinical Social Work Note (Signed)
With patient permission, CSW faxed referral to [facility]. Per intake RN Melissa, no bed availability expected for 3 weeks.  Tilden Fossa, MSW, [place] Worker [facility] [phone]

## 2013-10-07 NOTE — Progress Notes (Signed)
[facility] MD Progress Note  10/07/2013 4:18 PM Brandy Wise  MRN:  [id number] Subjective:  States she is not feeling well. She is actively withdrawing. Having a really hard time. She communicated with her husband who is being supportive. States she thought he was trough with her. She is was supposed to be in court today.  Diagnosis:   DSM5: Substance/Addictive Disorders:  Opioid Disorder - Severe (304.00), Amphetamine use disorder Depressive Disorders:  Major Depressive Disorder - Severe (296.23) Total Time spent with patient: 30 minutes  Axis I: Substance Induced Mood Disorder  ADL's:  Intact  Sleep: Poor  Appetite:  Poor   Psychiatric Specialty Exam: Physical Exam  Review of Systems  Constitutional: Positive for malaise/fatigue and diaphoresis.  HENT: Negative.   Eyes: Negative.   Respiratory: Negative.   Cardiovascular: Negative.   Gastrointestinal: Negative.   Genitourinary: Negative.   Musculoskeletal: Positive for back pain and myalgias.  Skin: Negative.   Neurological: Positive for weakness.  Endo/Heme/Allergies: Negative.   Psychiatric/Behavioral: Positive for depression and substance abuse. The patient is nervous/anxious and has insomnia.     Blood pressure 106/86, pulse 91, temperature 98.9 F (37.2 C), temperature source Oral, resp. rate 18, height 5' 5"  (1.651 m), weight 85.276 kg (188 lb), last menstrual period 09/29/2013, SpO2 100.00%.Body mass index is 31.28 kg/(m^2).  General Appearance: Fairly Groomed  Engineer, water::  Fair  Speech:  Clear and Coherent  Volume:  Decreased  Mood:  Anxious, Depressed and Dysphoric  Affect:  Labile and Tearful  Thought Process:  Coherent and Goal Directed  Orientation:  Full (Time, Place, and Person)  Thought Content:  symptoms worries concerns  Suicidal Thoughts:  No  Homicidal Thoughts:  No  Memory:  Immediate;   Fair Recent;   Fair Remote;   Fair  Judgement:  Fair  Insight:  Present  Psychomotor Activity:   Restlessness  Concentration:  Fair  Recall:  [employer] of Knowledge:NA  Language: Fair  Akathisia:  No  Handed:    AIMS (if indicated):     Assets:  Desire for Improvement Social Support  Sleep:  Number of Hours: 6.75   Musculoskeletal: Strength & Muscle Tone: within normal limits Gait & Station: normal Patient leans: N/A  Current Medications: Current Facility-Administered Medications  Medication Dose Route Frequency Provider Last Rate Last Dose  . acetaminophen (TYLENOL) tablet 650 mg  650 mg Oral Q4H PRN Clarene Reamer, MD      . acetaminophen (TYLENOL) tablet 650 mg  650 mg Oral Q6H PRN Clarene Reamer, MD      . alum & mag hydroxide-simeth (MAALOX/MYLANTA) 200-200-20 MG/5ML suspension 30 mL  30 mL Oral Q4H PRN Clarene Reamer, MD      . cloNIDine (CATAPRES) tablet 0.1 mg  0.1 mg Oral QID Nicholaus Bloom, MD   0.1 mg at 10/07/13 1153   Followed by  . [START ON 10/08/2013] cloNIDine (CATAPRES) tablet 0.1 mg  0.1 mg Oral BH-qamhs Nicholaus Bloom, MD       Followed by  . [START ON 10/10/2013] cloNIDine (CATAPRES) tablet 0.1 mg  0.1 mg Oral QAC breakfast Nicholaus Bloom, MD      . dicyclomine (BENTYL) tablet 20 mg  20 mg Oral Q6H PRN Nicholaus Bloom, MD      . hydrOXYzine (ATARAX/VISTARIL) tablet 25 mg  25 mg Oral Q6H PRN Nicholaus Bloom, MD      . loperamide (IMODIUM) capsule 2-4 mg  2-4 mg Oral PRN Geralyn Flash  A Buffy Ehler, MD      . LORazepam (ATIVAN) tablet 1 mg  1 mg Oral Q4H PRN Clarene Reamer, MD   1 mg at 10/07/13 1153  . magnesium hydroxide (MILK OF MAGNESIA) suspension 30 mL  30 mL Oral Daily PRN Clarene Reamer, MD      . methocarbamol (ROBAXIN) tablet 500 mg  500 mg Oral Q8H PRN Nicholaus Bloom, MD      . naproxen (NAPROSYN) tablet 500 mg  500 mg Oral BID PRN Nicholaus Bloom, MD      . nicotine (NICODERM CQ - dosed in mg/24 hours) patch 21 mg  21 mg Transdermal Daily Clarene Reamer, MD   21 mg at 10/07/13 0840  . ondansetron (ZOFRAN-ODT) disintegrating tablet 4 mg  4 mg Oral Q6H PRN Nicholaus Bloom, MD   4 mg at 10/07/13 1036  . potassium chloride SA (K-DUR,KLOR-CON) CR tablet 20 mEq  20 mEq Oral BID Nicholaus Bloom, MD   20 mEq at 10/07/13 9244  . zolpidem (AMBIEN) tablet 10 mg  10 mg Oral QHS PRN Nicholaus Bloom, MD   10 mg at 10/06/13 2118    Lab Results:  Results for orders placed during the hospital encounter of 10/05/13 (from the past 48 hour(s))  URINALYSIS, ROUTINE W REFLEX MICROSCOPIC     Status: Abnormal   Collection Time    10/05/13  9:19 PM      Result Value Ref Range   Color, Urine AMBER (*) YELLOW   Comment: BIOCHEMICALS MAY BE AFFECTED BY COLOR   APPearance CLOUDY (*) CLEAR   Specific Gravity, Urine 1.024  1.005 - 1.030   pH 6.0  5.0 - 8.0   Glucose, UA NEGATIVE  NEGATIVE mg/dL   Hgb urine dipstick LARGE (*) NEGATIVE   Bilirubin Urine SMALL (*) NEGATIVE   Ketones, ur NEGATIVE  NEGATIVE mg/dL   Protein, ur 30 (*) NEGATIVE mg/dL   Urobilinogen, UA 1.0  0.0 - 1.0 mg/dL   Nitrite POSITIVE (*) NEGATIVE   Leukocytes, UA SMALL (*) NEGATIVE  URINE RAPID DRUG SCREEN (HOSP PERFORMED)     Status: Abnormal   Collection Time    10/05/13  9:19 PM      Result Value Ref Range   Opiates NONE DETECTED  NONE DETECTED   Cocaine NONE DETECTED  NONE DETECTED   Benzodiazepines POSITIVE (*) NONE DETECTED   Amphetamines POSITIVE (*) NONE DETECTED   Tetrahydrocannabinol POSITIVE (*) NONE DETECTED   Barbiturates NONE DETECTED  NONE DETECTED   Comment:            DRUG SCREEN FOR MEDICAL PURPOSES     ONLY.  IF CONFIRMATION IS NEEDED     FOR ANY PURPOSE, NOTIFY LAB     WITHIN 5 DAYS.                LOWEST DETECTABLE LIMITS     FOR URINE DRUG SCREEN     Drug Class       Cutoff (ng/mL)     Amphetamine      1000     Barbiturate      200     Benzodiazepine   628     Tricyclics       638     Opiates          300     Cocaine          300     THC  50  URINE MICROSCOPIC-ADD ON     Status: Abnormal   Collection Time    10/05/13  9:19 PM      Result Value Ref Range    Squamous Epithelial / LPF MANY (*) RARE   WBC, UA 3-6  <3 WBC/hpf   RBC / HPF 11-20  <3 RBC/hpf   Bacteria, UA MANY (*) RARE   Crystals CA OXALATE CRYSTALS (*) NEGATIVE   Urine-Other MUCOUS PRESENT    CBC WITH DIFFERENTIAL     Status: Abnormal   Collection Time    10/05/13  9:45 PM      Result Value Ref Range   WBC 3.7 (*) 4.0 - 10.5 K/uL   RBC 4.65  3.87 - 5.11 MIL/uL   Hemoglobin 13.1  12.0 - 15.0 g/dL   HCT 38.4  36.0 - 46.0 %   MCV 82.6  78.0 - 100.0 fL   MCH 28.2  26.0 - 34.0 pg   MCHC 34.1  30.0 - 36.0 g/dL   RDW 15.0  11.5 - 15.5 %   Platelets 238  150 - 400 K/uL   Neutrophils Relative % 54  43 - 77 %   Neutro Abs 2.0  1.7 - 7.7 K/uL   Lymphocytes Relative 39  12 - 46 %   Lymphs Abs 1.4  0.7 - 4.0 K/uL   Monocytes Relative 6  3 - 12 %   Monocytes Absolute 0.2  0.1 - 1.0 K/uL   Eosinophils Relative 1  0 - 5 %   Eosinophils Absolute 0.0  0.0 - 0.7 K/uL   Basophils Relative 0  0 - 1 %   Basophils Absolute 0.0  0.0 - 0.1 K/uL  BASIC METABOLIC PANEL     Status: Abnormal   Collection Time    10/05/13  9:45 PM      Result Value Ref Range   Sodium 142  137 - 147 mEq/L   Potassium 2.9 (*) 3.7 - 5.3 mEq/L   Comment: CRITICAL RESULT CALLED TO, READ BACK BY AND VERIFIED WITH:     LEDWELL,A/ED @2249  ON 10/05/13 BY KARCZEWSKI,S.   Chloride 103  96 - 112 mEq/L   CO2 24  19 - 32 mEq/L   Glucose, Bld 91  70 - 99 mg/dL   BUN 7  6 - 23 mg/dL   Creatinine, Ser 0.83  0.50 - 1.10 mg/dL   Calcium 10.2  8.4 - 10.5 mg/dL   GFR calc non Af Amer >90  >90 mL/min   GFR calc Af Amer >90  >90 mL/min   Comment: (NOTE)     The eGFR has been calculated using the CKD EPI equation.     This calculation has not been validated in all clinical situations.     eGFR's persistently <90 mL/min signify possible Chronic Kidney     Disease.   Anion gap 15  5 - 15  ETHANOL     Status: None   Collection Time    10/05/13  9:45 PM      Result Value Ref Range   Alcohol, Ethyl (B) <11  0 - 11 mg/dL    Comment:            LOWEST DETECTABLE LIMIT FOR     SERUM ALCOHOL IS 11 mg/dL     FOR MEDICAL PURPOSES ONLY  SALICYLATE LEVEL     Status: Abnormal   Collection Time    10/05/13  9:45 PM      Result Value  Ref Range   Salicylate Lvl <4.2 (*) 2.8 - 20.0 mg/dL  ACETAMINOPHEN LEVEL     Status: None   Collection Time    10/05/13  9:45 PM      Result Value Ref Range   Acetaminophen (Tylenol), Serum <15.0  10 - 30 ug/mL   Comment:            THERAPEUTIC CONCENTRATIONS VARY     SIGNIFICANTLY. A RANGE OF 10-30     ug/mL MAY BE AN EFFECTIVE     CONCENTRATION FOR MANY PATIENTS.     HOWEVER, SOME ARE BEST TREATED     AT CONCENTRATIONS OUTSIDE THIS     RANGE.     ACETAMINOPHEN CONCENTRATIONS     >150 ug/mL AT 4 HOURS AFTER     INGESTION AND >50 ug/mL AT 12     HOURS AFTER INGESTION ARE     OFTEN ASSOCIATED WITH TOXIC     REACTIONS.  POTASSIUM     Status: None   Collection Time    10/06/13  9:00 AM      Result Value Ref Range   Potassium 4.3  3.7 - 5.3 mEq/L   Comment: DELTA CHECK NOTED     NO VISIBLE HEMOLYSIS    Physical Findings: AIMS:  , ,  ,  ,    CIWA:    COWS:  COWS Total Score: 9  Treatment Plan Summary: Daily contact with patient to assess and evaluate symptoms and progress in treatment Medication management  Plan: Supportive approach/coping skills/relapse prevention           Continue detox           Reassess and address the co morbities  Medical Decision Making Problem Points:  Established problem, worsening (2) and Review of psycho-social stressors (1) Data Points:  Review of medication regiment & side effects (2) Review of new medications or change in dosage (2)  I certify that inpatient services furnished can reasonably be expected to improve the patient's condition.   Tyarra Nolton A 10/07/2013, 4:18 PM

## 2013-10-07 NOTE — Progress Notes (Signed)
D: Patient appropriate and cooperative with staff. Patient has anxious affect and depressed mood. She reported on the self inventory sheet that sleep, appetite and ability to pay attention are all poor. Patient rated depression/anxiety "10" and feelings of hopelessness "7". She's attending some of the group sessions. Patient adheres to medication regimen.  A: Support and encouragement provided to patient. Administered scheduled medications per ordering MD. Monitor Q15 minute checks for safety.  R: Patient receptive. Passive SI, but contracts for safety. Denies HI and AVH. Patient remains safe on the unit.

## 2013-10-07 NOTE — Clinical Social Work Note (Signed)
CSW contacted [facility] in [place]- per staff member, there is no bed availability expected until September. Agency is no longer adding clients to wait list at this time.  Tilden Fossa, MSW, [place] Worker [facility] [phone]
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
## 2013-10-07 NOTE — Progress Notes (Signed)
Adult Psychoeducational Group Note  Date:  10/07/2013 Time:  9:00 AM  Group Topic/Focus:  Morning Wellness Group   Participation Level:  Did Not Attend  Additional Comments: Patient was meeting with the social worker during this group.  Kathlen Brunswick 10/07/2013, 6:51 PM
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
## 2013-10-08 MED ORDER — SULFAMETHOXAZOLE-TMP DS 800-160 MG PO TABS
1.0000 | ORAL_TABLET | Freq: Two times a day (BID) | ORAL | Status: AC
  Administered 2013-10-08 – 2013-10-10 (×6): 1 via ORAL
  Filled 2013-10-08 (×6): qty 1

## 2013-10-08 MED ORDER — MIRTAZAPINE 15 MG PO TABS
15.0000 mg | ORAL_TABLET | Freq: Every day | ORAL | Status: DC
  Administered 2013-10-08 – 2013-10-10 (×3): 15 mg via ORAL
  Filled 2013-10-08: qty 1
  Filled 2013-10-08: qty 4
  Filled 2013-10-08 (×3): qty 1
  Filled 2013-10-08: qty 4
  Filled 2013-10-08: qty 1

## 2013-10-08 MED ORDER — PAROXETINE HCL 10 MG PO TABS
10.0000 mg | ORAL_TABLET | Freq: Every day | ORAL | Status: DC
  Administered 2013-10-08 – 2013-10-11 (×4): 10 mg via ORAL
  Filled 2013-10-08 (×3): qty 1
  Filled 2013-10-08: qty 4
  Filled 2013-10-08 (×2): qty 1
  Filled 2013-10-08: qty 4
  Filled 2013-10-08: qty 1

## 2013-10-08 MED ORDER — BUPROPION HCL ER (XL) 150 MG PO TB24
150.0000 mg | ORAL_TABLET | Freq: Every day | ORAL | Status: DC
  Administered 2013-10-09 – 2013-10-11 (×3): 150 mg via ORAL
  Filled 2013-10-08: qty 4
  Filled 2013-10-08: qty 1
  Filled 2013-10-08: qty 4
  Filled 2013-10-08 (×3): qty 1

## 2013-10-08 NOTE — Progress Notes (Signed)
[facility] MD Progress Note  10/08/2013 5:54 PM Brandy Wise  MRN:  [id number] Subjective:  Withdrawing from opioids. States that the withdrawal symptoms have been better today. States she needs help with an antidepressant. Has taken a number of them states Paxil did help some. Does endorse lack of energy, lack of motivation. Will consider Wellbutrin. She states she really needs to go to rehab. States she will not be able to make it if she does not go. States if she relapsed she is pretty sure her husband will leave her and take the kids Diagnosis:   DSM5: Trauma-Stressor Disorders:  Posttraumatic Stress Disorder (309.81) Substance/Addictive Disorders:  Opioid Disorder - Severe (304.00), Amphetamine Use Disorder Depressive Disorders:  Major Depressive Disorder - Severe (296.23) Total Time spent with patient: 30 minutes  Axis I: Bipolar, Depressed  ADL's:  Intact  Sleep: Fair  Appetite:  Poor  SPsychiatric Specialty Exam: Physical Exam  Review of Systems  Constitutional: Positive for malaise/fatigue and diaphoresis.  HENT: Negative.   Eyes: Negative.   Respiratory: Negative.   Cardiovascular: Negative.   Gastrointestinal: Negative.   Genitourinary: Negative.   Musculoskeletal: Positive for myalgias.  Skin: Negative.   Neurological: Positive for weakness.  Endo/Heme/Allergies: Negative.   Psychiatric/Behavioral: Positive for depression and substance abuse. The patient is nervous/anxious.     Blood pressure 106/64, pulse 97, temperature 97.8 F (36.6 C), temperature source Oral, resp. rate 18, height 5\' 5"  (1.651 m), weight 85.276 kg (188 lb), last menstrual period 09/29/2013, SpO2 100.00%.Body mass index is 31.28 kg/(m^2).  General Appearance: Fairly Groomed  Engineer, water::  Fair  Speech:  Clear and Coherent  Volume:  Decreased  Mood:  Anxious and Depressed  Affect:  Depressed and Tearful  Thought Process:  Coherent and Goal Directed  Orientation:  Full (Time, Place, and Person)   Thought Content:  symptoms events worries concerns  Suicidal Thoughts:  No  Homicidal Thoughts:  No  Memory:  Immediate;   Fair Recent;   Fair Remote;   Fair  Judgement:  Fair  Insight:  Present  Psychomotor Activity:  Restlessness  Concentration:  Fair  Recall:  [employer] of Knowledge:NA  Language: Fair  Akathisia:  No  Handed:    AIMS (if indicated):     Assets:  Desire for Improvement Housing Social Support  Sleep:  Number of Hours: 6.5   Musculoskeletal: Strength & Muscle Tone: within normal limits Gait & Station: normal Patient leans: N/A  Current Medications: Current Facility-Administered Medications  Medication Dose Route Frequency Provider Last Rate Last Dose  . acetaminophen (TYLENOL) tablet 650 mg  650 mg Oral Q4H PRN Clarene Reamer, MD      . acetaminophen (TYLENOL) tablet 650 mg  650 mg Oral Q6H PRN Clarene Reamer, MD      . alum & mag hydroxide-simeth (MAALOX/MYLANTA) 200-200-20 MG/5ML suspension 30 mL  30 mL Oral Q4H PRN Clarene Reamer, MD      . Derrill Memo ON 10/09/2013] buPROPion (WELLBUTRIN XL) 24 hr tablet 150 mg  150 mg Oral Daily Nicholaus Bloom, MD      . cloNIDine (CATAPRES) tablet 0.1 mg  0.1 mg Oral BH-qamhs Nicholaus Bloom, MD   0.1 mg at 10/08/13 2330   Followed by  . [START ON 10/10/2013] cloNIDine (CATAPRES) tablet 0.1 mg  0.1 mg Oral QAC breakfast Nicholaus Bloom, MD   0.1 mg at 10/08/13 0804  . dicyclomine (BENTYL) tablet 20 mg  20 mg Oral Q6H PRN Woodroe Chen  Sabra Heck, MD   20 mg at 10/08/13 1121  . hydrOXYzine (ATARAX/VISTARIL) tablet 25 mg  25 mg Oral Q6H PRN Nicholaus Bloom, MD      . loperamide (IMODIUM) capsule 2-4 mg  2-4 mg Oral PRN Nicholaus Bloom, MD      . LORazepam (ATIVAN) tablet 1 mg  1 mg Oral Q4H PRN Clarene Reamer, MD   1 mg at 10/08/13 1603  . magnesium hydroxide (MILK OF MAGNESIA) suspension 30 mL  30 mL Oral Daily PRN Clarene Reamer, MD      . methocarbamol (ROBAXIN) tablet 500 mg  500 mg Oral Q8H PRN Nicholaus Bloom, MD   500 mg at 10/08/13  1559  . mirtazapine (REMERON) tablet 15 mg  15 mg Oral QHS Nicholaus Bloom, MD      . naproxen (NAPROSYN) tablet 500 mg  500 mg Oral BID PRN Nicholaus Bloom, MD      . nicotine (NICODERM CQ - dosed in mg/24 hours) patch 21 mg  21 mg Transdermal Daily Clarene Reamer, MD   21 mg at 10/08/13 0805  . ondansetron (ZOFRAN-ODT) disintegrating tablet 4 mg  4 mg Oral Q6H PRN Nicholaus Bloom, MD   4 mg at 10/08/13 [phone]  . PARoxetine (PAXIL) tablet 10 mg  10 mg Oral Daily Nicholaus Bloom, MD   10 mg at 10/08/13 1347  . potassium chloride SA (K-DUR,KLOR-CON) CR tablet 20 mEq  20 mEq Oral BID Nicholaus Bloom, MD   20 mEq at 10/08/13 1649  . sulfamethoxazole-trimethoprim (BACTRIM DS) 800-160 MG per tablet 1 tablet  1 tablet Oral Q12H Lurena Nida, NP   1 tablet at 10/08/13 1700  . zolpidem (AMBIEN) tablet 10 mg  10 mg Oral QHS PRN Nicholaus Bloom, MD   10 mg at 10/07/13 2207    Lab Results: No results found for this or any previous visit (from the past 48 hour(s)).  Physical Findings: AIMS: Facial and Oral Movements Muscles of Facial Expression: None, normal Lips and Perioral Area: None, normal Jaw: None, normal Tongue: None, normal,Extremity Movements Upper (arms, wrists, hands, fingers): None, normal Lower (legs, knees, ankles, toes): None, normal, Trunk Movements Neck, shoulders, hips: None, normal, Overall Severity Severity of abnormal movements (highest score from questions above): None, normal Incapacitation due to abnormal movements: None, normal Patient's awareness of abnormal movements (rate only patient's report): No Awareness, Dental Status Current problems with teeth and/or dentures?: No Does patient usually wear dentures?: No  CIWA:    COWS:  COWS Total Score: 8  Treatment Plan Summary: Daily contact with patient to assess and evaluate symptoms and progress in treatment Medication management  Plan: Supportive approach/coping skills/relapse prevention           Continue the detox            Reassess and address the co morbidities           Trial with Paxil/Wellbutrin  Medical Decision Making Problem Points:  Review of psycho-social stressors (1) Data Points:  Review of medication regiment & side effects (2) Review of new medications or change in dosage (2)  I certify that inpatient services furnished can reasonably be expected to improve the patient's condition.   Clerence Gubser A 10/08/2013, 5:54 PM

## 2013-10-08 NOTE — Clinical Social Work Note (Signed)
CSW left message for [place] Coordinator Gwen Her [phone] regarding referral process, awaiting return call.   Tilden Fossa, MSW, [place] Worker [facility] [phone]

## 2013-10-08 NOTE — Progress Notes (Signed)
Patient ID: Brandy Wise, female   DOB: 24-Jun-1980, 33 y.o.   MRN: [id number] Received call from Ronalee Belts, [place] on Adult Unit regarding patient's urinalysis result. Urinalysis result from 10/05/13 reviewed. Patient was noted to have an UTI in the Emergency Department and was to receive Rx for Keflex at time of discharge.   Plan: Will start patient on Bactrim DS 1 tab PO BID x 3 days.  Serena Colonel, FNP-BC [place] Patient seen, evaluated and I agree with notes by Nurse Practitioner. Corena Pilgrim, MD
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
## 2013-10-08 NOTE — BHH Suicide Risk Assessment (Signed)
[facility] INPATIENT:  Family/Significant Other Suicide Prevention Education  Suicide Prevention Education:  Education Completed; Oretha Milch [phone],  (name of family member/significant other) has been identified by the patient as the family member/significant other with whom the patient will be residing, and identified as the person(s) who will aid the patient in the event of a mental health crisis (suicidal ideations/suicide attempt).  With written consent from the patient, the family member/significant other has been provided the following suicide prevention education, prior to the and/or following the discharge of the patient.  The suicide prevention education provided includes the following:  Suicide risk factors  Suicide prevention and interventions  National Suicide Hotline telephone number  [facility] assessment telephone number  [facility] Emergency Assistance [place] and/or Residential Mobile Crisis Unit telephone number  Request made of family/significant other to:  Remove weapons (e.g., guns, rifles, knives), all items previously/currently identified as safety concern.    Remove drugs/medications (over-the-counter, prescriptions, illicit drugs), all items previously/currently identified as a safety concern.  The family member/significant other verbalizes understanding of the suicide prevention education information provided.  The family member/significant other agrees to remove the items of safety concern listed above.  Tahtiana Rozier, Casimiro Needle 10/08/2013, 11:26 AM

## 2013-10-08 NOTE — Tx Team (Addendum)
Interdisciplinary Treatment Plan Update (Adult) Date: 10/08/2013   Time Reviewed: 9:30 AM  Progress in Treatment: Attending groups: Yes Participating in groups: Yes Taking medication as prescribed: Yes Tolerating medication: Yes Family/Significant other contact made: Yes Patient understands diagnosis: Yes Discussing patient identified problems/goals with staff: Yes Medical problems stabilized or resolved: Yes Denies suicidal/homicidal ideation: CSW still assessing Issues/concerns per patient self-inventory: Yes Other:  New problem(s) identified: N/A  Discharge Plan or Barriers: CSW continuing to assess. Patient declined at [facility] due to pending legal charges and no availability at [facility]. [facility] referral made on 10/07/13. Patient requesting residential substance abuse treatment.  Reason for Continuation of Hospitalization:  Depression Anxiety Medication Stabilization   Comments: N/A  Estimated length of stay: 2-4 days  For review of initial/current patient goals, please see plan of care.  Attendees: Patient:    Family:    Physician:    Nursing: Drake Leach, RN 10/08/2013 9:30 AM  Clinical Social Worker: Erasmo Downer Zamar Odwyer,  [place] 10/08/2013 9:30 AM  Other: Joette Catching, LCSW 10/08/2013 9:30 AM  Other: Patrecia Pace, RN  10/08/2013 9:30 AM  Other: Dorene Ar, RN  10/08/2013 9:30 AM  Other: Huel Cote, NP 10/08/2013 9:30 AM  Other: Maxie Better, LCSWA 10/08/2013 9:30 AM  Other: Edwyna Shell, LCSW 10/08/2013 9:30 AM  Other: Lucinda Dell, [place] Liaison  10/08/2013 9:30 AM  Other:    Other:      Scribe for Treatment Team:  Tilden Fossa, MSW, [employer] [phone]

## 2013-10-08 NOTE — Clinical Social Work Note (Signed)
CSW left message for [facility] Paula Compton [phone]) regarding follow up services. Awaiting return call.  Tilden Fossa, MSW, [place] Worker [facility] [phone]

## 2013-10-08 NOTE — Clinical Social Work Note (Signed)
CSW left voicemail for patient's parole officer ([place].) Brandy Wise [phone]. Awaiting return call.  Tilden Fossa, MSW, [place] Worker [facility] [phone]

## 2013-10-08 NOTE — Clinical Social Work Note (Addendum)
CSW spoke with patient's parole officer Nichola Sizer- [phone] who states that patient has a warrant out for her arrest for not attending a court date prior to hospital admission. Officer Eulas Post will staff her case with his supervisor on Monday 10/11/13 and contact CSW regarding if patient can go to residential treatment at discharge.  CSW left voicemail for admissions supervisor at [facility] regarding referral. Awaiting return call.  CSW received call from patient's [place] who will follow up with CSW on Tuesday for disposition update.   Tilden Fossa, MSW, [place] Worker [facility] [phone]

## 2013-10-08 NOTE — Clinical Social Work Note (Signed)
Patient not eligible for BATS program as she does not have [place] Medicaid, confirmed with [place] Coordinator.   Tilden Fossa, MSW, [place] Worker [facility] [phone]

## 2013-10-08 NOTE — BHH Group Notes (Signed)
[facility] LCSW Aftercare Discharge Planning Group Note  10/08/2013  9:20 AM  Participation Quality: Did Not Attend.  Tilden Fossa, MSW, [place] Worker [facility] [phone]

## 2013-10-09 NOTE — Progress Notes (Signed)
Adult Psychoeducational Group Note  Date:  10/09/2013 Time:  8:00 PM   Group Topic/Focus:  Wrap-Up Group:   The focus of this group is to help patients review their daily goal of treatment and discuss progress on daily workbooks.  Participation Level:  Minimal  Participation Quality:  Appropriate  Affect:  Flat  Cognitive:  Lacking  Insight: Limited  Engagement in Group:  Developing/Improving and Supportive  Modes of Intervention:  Support  Additional Comments:  Seeking assistance to become clean from substances.  Leroy Sea N 10/09/2013, 12:07 AM

## 2013-10-09 NOTE — Progress Notes (Signed)
[facility] MD Progress Note  10/09/2013 2:52 PM Brandy Wise  MRN:  [id number] Subjective:  States she did sleep last night. She is still having some anxiety, some of the aches and pains and the skin sensation but admits it is getting better. Will like to continue to work on finding medications that could help her manage her mood so her depression, anxiety, mood instability does not become Wise reason for relapse. She still states she needs more help past detox. States she really needs to go to rehab. Diagnosis:   DSM5: Trauma-Stressor Disorders:  Posttraumatic Stress Disorder (309.81) Substance/Addictive Disorders:  Opioid Disorder - Severe (304.00), Amphetamine Use Disorder Depressive Disorders:  Major Depressive Disorder - Moderate (296.22) Total Time spent with patient: 30 minutes  Axis I: Bipolar, Depressed and Substance Induced Mood Disorder  ADL's:  Intact  Sleep: Fair  Appetite:  Fair  Psychiatric Specialty Exam: Physical Exam  Review of Systems  Constitutional: Positive for malaise/fatigue.  HENT: Negative.   Eyes: Negative.   Respiratory: Negative.   Cardiovascular: Negative.   Gastrointestinal: Negative.   Genitourinary: Negative.   Musculoskeletal: Positive for myalgias.  Skin: Negative.   Neurological: Positive for dizziness.  Endo/Heme/Allergies: Negative.   Psychiatric/Behavioral: Positive for substance abuse. The patient is nervous/anxious.     Blood pressure 89/69, pulse 92, temperature 98 F (36.7 C), temperature source Oral, resp. rate 16, height 5\' 5"  (1.651 m), weight 85.276 kg (188 lb), last menstrual period 09/29/2013, SpO2 100.00%.Body mass index is 31.28 kg/(m^2).  General Appearance: Fairly Groomed  Engineer, water::  Fair  Speech:  Clear and Coherent  Volume:  Normal  Mood:  Anxious and worried  Affect:  anxious, worried  Thought Process:  Coherent and Goal Directed  Orientation:  Full (Time, Place, and Person)  Thought Content:  symptoms events worries  concerns  Suicidal Thoughts:  No  Homicidal Thoughts:  No  Memory:  Immediate;   Fair Recent;   Fair Remote;   Fair  Judgement:  Fair  Insight:  Present  Psychomotor Activity:  Restlessness  Concentration:  Fair  Recall:  [employer] of Knowledge:NA  Language: Fair  Akathisia:  No  Handed:    AIMS (if indicated):     Assets:  Desire for Improvement Housing Social Support  Sleep:  Number of Hours: 6.5   Musculoskeletal: Strength & Muscle Tone: within normal limits Gait & Station: normal Patient leans: N/Wise  Current Medications: Current Facility-Administered Medications  Medication Dose Route Frequency Provider Last Rate Last Dose  . acetaminophen (TYLENOL) tablet 650 mg  650 mg Oral Q4H PRN Clarene Reamer, MD      . acetaminophen (TYLENOL) tablet 650 mg  650 mg Oral Q6H PRN Clarene Reamer, MD      . alum & mag hydroxide-simeth (MAALOX/MYLANTA) 200-200-20 MG/5ML suspension 30 mL  30 mL Oral Q4H PRN Clarene Reamer, MD      . buPROPion (WELLBUTRIN XL) 24 hr tablet 150 mg  150 mg Oral Daily Nicholaus Bloom, MD   150 mg at 10/09/13 1009  . cloNIDine (CATAPRES) tablet 0.1 mg  0.1 mg Oral BH-qamhs Nicholaus Bloom, MD   0.1 mg at 10/09/13 1010   Followed by  . [START ON 10/10/2013] cloNIDine (CATAPRES) tablet 0.1 mg  0.1 mg Oral QAC breakfast Nicholaus Bloom, MD   0.1 mg at 10/08/13 0804  . dicyclomine (BENTYL) tablet 20 mg  20 mg Oral Q6H PRN Nicholaus Bloom, MD   20 mg at  10/08/13 1121  . hydrOXYzine (ATARAX/VISTARIL) tablet 25 mg  25 mg Oral Q6H PRN Nicholaus Bloom, MD      . loperamide (IMODIUM) capsule 2-4 mg  2-4 mg Oral PRN Nicholaus Bloom, MD      . LORazepam (ATIVAN) tablet 1 mg  1 mg Oral Q4H PRN Clarene Reamer, MD   1 mg at 10/09/13 1011  . magnesium hydroxide (MILK OF MAGNESIA) suspension 30 mL  30 mL Oral Daily PRN Clarene Reamer, MD      . methocarbamol (ROBAXIN) tablet 500 mg  500 mg Oral Q8H PRN Nicholaus Bloom, MD   500 mg at 10/09/13 1011  . mirtazapine (REMERON) tablet 15 mg   15 mg Oral QHS Nicholaus Bloom, MD   15 mg at 10/08/13 2200  . naproxen (NAPROSYN) tablet 500 mg  500 mg Oral BID PRN Nicholaus Bloom, MD   500 mg at 10/08/13 1842  . nicotine (NICODERM CQ - dosed in mg/24 hours) patch 21 mg  21 mg Transdermal Daily Clarene Reamer, MD   21 mg at 10/08/13 0805  . ondansetron (ZOFRAN-ODT) disintegrating tablet 4 mg  4 mg Oral Q6H PRN Nicholaus Bloom, MD   4 mg at 10/08/13 1842  . PARoxetine (PAXIL) tablet 10 mg  10 mg Oral Daily Nicholaus Bloom, MD   10 mg at 10/09/13 1009  . sulfamethoxazole-trimethoprim (BACTRIM DS) 800-160 MG per tablet 1 tablet  1 tablet Oral Q12H Lurena Nida, NP   1 tablet at 10/09/13 1009  . zolpidem (AMBIEN) tablet 10 mg  10 mg Oral QHS PRN Nicholaus Bloom, MD   10 mg at 10/07/13 2207    Lab Results: No results found for this or any previous visit (from the past 48 hour(s)).  Physical Findings: AIMS: Facial and Oral Movements Muscles of Facial Expression: None, normal Lips and Perioral Area: None, normal Jaw: None, normal Tongue: None, normal,Extremity Movements Upper (arms, wrists, hands, fingers): None, normal Lower (legs, knees, ankles, toes): None, normal, Trunk Movements Neck, shoulders, hips: None, normal, Overall Severity Severity of abnormal movements (highest score from questions above): None, normal Incapacitation due to abnormal movements: None, normal Patient's awareness of abnormal movements (rate only patient's report): No Awareness, Dental Status Current problems with teeth and/or dentures?: No Does patient usually wear dentures?: No  CIWA:    COWS:  COWS Total Score: 13  Treatment Plan Summary: Daily contact with patient to assess and evaluate symptoms and progress in treatment Medication management  Plan: Supportive approach/coping skills/relapse prevention           Continue detox           Optimize treatment with psychotropics           Explore rehab options Medical Decision Making Problem Points:  Review of  psycho-social stressors (1) Data Points:  Review of medication regiment & side effects (2) Review of new medications or change in dosage (2)  I certify that inpatient services furnished can reasonably be expected to improve the patient's condition.   Brandy Wise 10/09/2013, 2:52 PM

## 2013-10-09 NOTE — Progress Notes (Signed)
Patient did not attend group.  Patient remained in room.

## 2013-10-09 NOTE — Progress Notes (Signed)
Psychoeducational Group Note  Date:  10/09/2013 Time:  1030 Group Topic/Focus:  Identifying Needs:   The focus of this group is to help patients identify their personal needs that have been historically problematic and identify healthy behaviors to address their needs.  Participation Level:  Active  Participation Quality:  Appropriate  Affect:  Depressed  Cognitive:  Appropriate  Insight:  Limited  Engagement in Group:  Limited  Additional Comments:    10/09/2013,12:24 PM Gao Mitnick, Trixie Rude

## 2013-10-09 NOTE — Progress Notes (Signed)
Brandy Wise has held it together today... She has slept much of the day, but when she woke up, she immediately came to med window and requested prn medicines.   A He r affect is flat, sad and ddperessed. She completed her morning assessment and on it she wrote she has had SI  Within the past 24 hrs, but she contracts with this nurse for safety. She rated her depression, hopelessness and anxiety "12/03/08" and sated she just wants to get thgough this. She has received 3 prn doses today, all that have done a good job of getting her anxiety under control.   R Safety is ion place and poc cont.

## 2013-10-09 NOTE — [facility] Group Notes (Signed)
[facility] Group Notes: (Clinical Social Work)   10/09/2013      Type of Therapy:  Group Therapy   Participation Level:  Did Not Attend    Selmer Dominion, LCSW 10/09/2013, 2:33 PM

## 2013-10-09 NOTE — Progress Notes (Signed)
D: Passive SI-contracts for safety.  Pt denies HI/AVH. Pt is pleasant and cooperative. Pt tearful at med window and asking how to kick the SA habit.   A: Pt was offered support and encouragement. Pt was given scheduled medications. Pt was encourage to attend groups. Q 15 minute checks were done for safety. Talked   R:Pt attends groups and interacts well with peers and staff. Pt is taking medication. Pt has no complaints.Pt receptive to treatment and safety maintained on unit.

## 2013-10-09 NOTE — Progress Notes (Signed)
Psychoeducational Group Note  Date:  10/09/2013 Time:  0930  Group Topic/Focus:  Identifying Needs:   The focus of this group is to help patients identify their personal needs that have been historically problematic and identify healthy behaviors to address their needs.  Participation Level:  Active  Participation Quality: fair  Affect: flat  Cognitive:    Insight:  good  Engagement in Group: engaged  Additional Comments:

## 2013-10-10 NOTE — Progress Notes (Signed)
D: Pt presents with flat affect and depressed, irritable mood.  Pt has been in her room at the beginning of the shift and states "I just don't feel well and people in there are irritating me, so I just want to be in here.  My goal for the day was to stay clean and communicate with my kids and I did that."  Pt denies SI/HI.  Denies AH/VH.   A: Medications administered per order.  Encouragement and support offered.  Safety maintained. R: Pt is cooperative with staff.  Pt is isolative with minimal peer interactions.  Will continue to monitor for safety.

## 2013-10-10 NOTE — Progress Notes (Signed)
[facility] MD Progress Note  10/10/2013 1:16 PM Brandy Wise  MRN:  [id number] Subjective:  States that she had been having a hard time after hearing from her mother that she thinks her husband is seeing someone. States she does not have any evidence and does not know how to ask him. States that they have been trough so much together. States that if he was having an affair she would like to work things out. She feels that physically she is slowly getting there. Does want to go to a residential treatment center as states she knows she is not going to make it without more help Diagnosis:   DSM5: Trauma-Stressor Disorders:  Posttraumatic Stress Disorder (309.81) Substance/Addictive Disorders:  Opioid Disorder - Severe (304.00), Amphetamine Abuse Depressive Disorders:  Major Depressive Disorder - Moderate (296.22) Total Time spent with patient: 30 minutes  Axis I: Bipolar, Depressed and Substance Induced Mood Disorder  ADL's:  Intact  Sleep: Fair  Appetite:  Fair   Psychiatric Specialty Exam: Physical Exam  Review of Systems  Constitutional: Negative.   HENT: Negative.   Eyes: Negative.   Respiratory: Negative.   Cardiovascular: Negative.   Gastrointestinal: Negative.   Genitourinary: Negative.   Musculoskeletal: Negative.   Skin: Negative.   Neurological: Negative.   Endo/Heme/Allergies: Negative.   Psychiatric/Behavioral: Positive for depression and substance abuse. The patient is nervous/anxious.     Blood pressure 99/62, pulse 93, temperature 98.3 F (36.8 C), temperature source Oral, resp. rate 18, height 5\' 5"  (1.651 m), weight 85.276 kg (188 lb), last menstrual period 09/29/2013, SpO2 100.00%.Body mass index is 31.28 kg/(m^2).  General Appearance: Fairly Groomed  Engineer, water::  Fair  Speech:  Clear and Coherent  Volume:  Normal  Mood:  Anxious and Depressed  Affect:  Restricted  Thought Process:  Coherent and Goal Directed  Orientation:  Full (Time, Place, and Person)   Thought Content:  symptms worries concerns  Suicidal Thoughts:  No  Homicidal Thoughts:  No  Memory:  Immediate;   Fair Recent;   Fair Remote;   Fair  Judgement:  Fair  Insight:  Present  Psychomotor Activity:  Restlessness  Concentration:  Fair  Recall:  [employer] of Knowledge:NA  Language: Fair  Akathisia:  No  Handed:    AIMS (if indicated):     Assets:  Desire for Improvement  Sleep:  Number of Hours: 6.25   Musculoskeletal: Strength & Muscle Tone: within normal limits Gait & Station: normal Patient leans: N/A  Current Medications: Current Facility-Administered Medications  Medication Dose Route Frequency Provider Last Rate Last Dose  . acetaminophen (TYLENOL) tablet 650 mg  650 mg Oral Q4H PRN Clarene Reamer, MD      . acetaminophen (TYLENOL) tablet 650 mg  650 mg Oral Q6H PRN Clarene Reamer, MD      . alum & mag hydroxide-simeth (MAALOX/MYLANTA) 200-200-20 MG/5ML suspension 30 mL  30 mL Oral Q4H PRN Clarene Reamer, MD      . buPROPion (WELLBUTRIN XL) 24 hr tablet 150 mg  150 mg Oral Daily Nicholaus Bloom, MD   150 mg at 10/10/13 0919  . dicyclomine (BENTYL) tablet 20 mg  20 mg Oral Q6H PRN Nicholaus Bloom, MD   20 mg at 10/08/13 1121  . hydrOXYzine (ATARAX/VISTARIL) tablet 25 mg  25 mg Oral Q6H PRN Nicholaus Bloom, MD      . loperamide (IMODIUM) capsule 2-4 mg  2-4 mg Oral PRN Nicholaus Bloom, MD      .  LORazepam (ATIVAN) tablet 1 mg  1 mg Oral Q4H PRN Clarene Reamer, MD   1 mg at 10/10/13 5027  . magnesium hydroxide (MILK OF MAGNESIA) suspension 30 mL  30 mL Oral Daily PRN Clarene Reamer, MD      . methocarbamol (ROBAXIN) tablet 500 mg  500 mg Oral Q8H PRN Nicholaus Bloom, MD   500 mg at 10/10/13 7412  . mirtazapine (REMERON) tablet 15 mg  15 mg Oral QHS Nicholaus Bloom, MD   15 mg at 10/09/13 2111  . naproxen (NAPROSYN) tablet 500 mg  500 mg Oral BID PRN Nicholaus Bloom, MD   500 mg at 10/08/13 1842  . nicotine (NICODERM CQ - dosed in mg/24 hours) patch 21 mg  21 mg  Transdermal Daily Clarene Reamer, MD   21 mg at 10/10/13 0800  . ondansetron (ZOFRAN-ODT) disintegrating tablet 4 mg  4 mg Oral Q6H PRN Nicholaus Bloom, MD   4 mg at 10/08/13 1842  . PARoxetine (PAXIL) tablet 10 mg  10 mg Oral Daily Nicholaus Bloom, MD   10 mg at 10/10/13 0920  . sulfamethoxazole-trimethoprim (BACTRIM DS) 800-160 MG per tablet 1 tablet  1 tablet Oral Q12H Lurena Nida, NP   1 tablet at 10/10/13 0920  . zolpidem (AMBIEN) tablet 10 mg  10 mg Oral QHS PRN Nicholaus Bloom, MD   10 mg at 10/07/13 2207    Lab Results: No results found for this or any previous visit (from the past 48 hour(s)).  Physical Findings: AIMS: Facial and Oral Movements Muscles of Facial Expression: None, normal Lips and Perioral Area: None, normal Jaw: None, normal Tongue: None, normal,Extremity Movements Upper (arms, wrists, hands, fingers): None, normal Lower (legs, knees, ankles, toes): None, normal, Trunk Movements Neck, shoulders, hips: None, normal, Overall Severity Severity of abnormal movements (highest score from questions above): None, normal Incapacitation due to abnormal movements: None, normal Patient's awareness of abnormal movements (rate only patient's report): No Awareness, Dental Status Current problems with teeth and/or dentures?: No Does patient usually wear dentures?: No  CIWA:    COWS:  COWS Total Score: 6  Treatment Plan Summary: Daily contact with patient to assess and evaluate symptoms and progress in treatment Medication management  Plan:  Supportive approach/coping skills/relapse prevention            CBT/mindfulness            Optimize response to psychotropics            Explore placement options Medical Decision Making Problem Points:  Review of psycho-social stressors (1) Data Points:  Review of medication regiment & side effects (2)  I certify that inpatient services furnished can reasonably be expected to improve the patient's condition.   Malory Spurr  A 10/10/2013, 1:16 PM

## 2013-10-10 NOTE — Progress Notes (Signed)
Brandy Wise  Is doing better with her opiate detox. She has been out in the milieu...attending her groups and her meals and is having less outwardly noticeable withdrawal symptoms and problems today.   A She takes her prn medications as requested and has ( both times) responded  Well with effectiveness to her withdrawal symptoms. She completed her daily assessment and on it she wrote she denied  SI within the past 24 hrs and she rated her depression, hopelessness and anxiety " 5/0/8", respectively.   R Safety is in place and poc moves forward.

## 2013-10-10 NOTE — Progress Notes (Signed)
D: Pt denies SI/HI/AVH. Pt is pleasant and cooperative. Pt stated she was doing good taking her meds.   A: Pt was offered support and encouragement. Pt was given scheduled medications. Pt was encourage to attend groups. Q 15 minute checks were done for safety.   R:Pt attends groups and interacts well with peers and staff. Pt is taking medication. Pt has no complaints at this time .Pt receptive to treatment and safety maintained on unit.

## 2013-10-10 NOTE — Progress Notes (Signed)
Psychoeducational Group Note  Date:  10/10/2013 Time:  0930 Group Topic/Focus:  Making Healthy Choices:   The focus of this group is to help patients identify negative/unhealthy choices they were using prior to admission and identify positive/healthier coping strategies to replace them upon discharge.  Participation Level:  Active  Participation Quality:  Attentive  Affect:  Flat  Cognitive:  Appropriate  Insight:  Engaged  Engagement in Group:  Engaged  Additional Comments:    Lauralyn Primes 10:31 AM. 10/10/2013

## 2013-10-10 NOTE — Progress Notes (Signed)
Psychoeducational Group Note  Date:  10/10/2013 Time:  1100  Group Topic/Focus:  Identifying Needs:   The focus of this group is to help patients identify their personal needs that have been historically problematic and identify healthy behaviors to address their needs.  Participation Level:  Did Not Attend  Participation Quality:    Affect:    Cognitive:    Insight:    Engagement in Group:    Additional Comments:   10/10/2013,1:52 PM Ector Laurel, Trixie Rude

## 2013-10-10 NOTE — Progress Notes (Signed)
Attended AA

## 2013-10-10 NOTE — [facility] Group Notes (Signed)
[facility] Group Notes:  (Clinical Social Work)  10/10/2013   1:15-2:15PM  Summary of Progress/Problems:  The main focus of today's process group was to   identify the patient's current support system and decide on other supports that can be put in place.  The picture on workbook was used to discuss why additional supports are needed.  An emphasis was placed on using counselor, doctor, therapy groups, 12-step groups, and problem-specific support groups to expand supports.   There was also an extensive discussion about what constitutes a healthy support versus an unhealthy support.  The patient expressed full comprehension of the concepts presented.  One current unhealthy support is a number of acquaintances that she has who encourage her to use drugs, and who won't take no for an answer.  Type of Therapy:  Process Group  Participation Level:  Active  Participation Quality:  Attentive   Affect:  Blunted  Cognitive:  Appropriate and Oriented  Insight:  Limited  Engagement in Therapy:  Limited  Modes of Intervention:  Education,  Support and [employer], LCSW 10/10/2013, 4:00pm

## 2013-10-11 DIAGNOSIS — F4310 Post-traumatic stress disorder, unspecified: Secondary | ICD-10-CM

## 2013-10-11 DIAGNOSIS — F331 Major depressive disorder, recurrent, moderate: Secondary | ICD-10-CM

## 2013-10-11 DIAGNOSIS — F1110 Opioid abuse, uncomplicated: Secondary | ICD-10-CM

## 2013-10-11 MED ORDER — MIRTAZAPINE 15 MG PO TABS: 15.0000 mg | ORAL_TABLET | Freq: Every day | ORAL | Status: AC

## 2013-10-11 MED ORDER — PAROXETINE HCL 10 MG PO TABS
10.0000 mg | ORAL_TABLET | Freq: Every day | ORAL | Status: AC
Start: 2013-10-11 — End: ?

## 2013-10-11 MED ORDER — LORAZEPAM 1 MG PO TABS: 1.0000 mg | ORAL_TABLET | ORAL | Status: AC | PRN

## 2013-10-11 MED ORDER — HYDROXYZINE HCL 25 MG PO TABS
25.0000 mg | ORAL_TABLET | Freq: Three times a day (TID) | ORAL | Status: DC | PRN
  Filled 2013-10-11: qty 12

## 2013-10-11 MED ORDER — BUPROPION HCL ER (XL) 150 MG PO TB24
150.0000 mg | ORAL_TABLET | Freq: Every day | ORAL | Status: AC
Start: 2013-10-11 — End: ?

## 2013-10-11 MED ORDER — HYDROXYZINE HCL 25 MG PO TABS: ORAL_TABLET | ORAL | Status: AC

## 2013-10-11 NOTE — Progress Notes (Signed)
NSG shift assessment. 7a-7p.   D: Affect blunted, mood depressed and anxious, behavior appropriate. Attends groups and participates. Cooperative with staff. Requested Ativan for anxiety and Robaxin for chronic leg cramps. Requested another Ativan prior to discharge because she felt nervous about going home. She discussed her liver cancer, that she only has 30% of her liver and she has been told that she is terminal because her cancer is stage 3.   A: Observed pt interacting in group and in the milieu: Support and encouragement offered. Safety maintained with observations every 15 minutes.   R:  Contracts for safety and continues to follow the treatment plan, working on learning new coping skills.

## 2013-10-11 NOTE — Discharge Summary (Signed)
Physician Discharge Summary Note  Patient:  Brandy Wise is an 33 y.o., female MRN:  [id number] DOB:  26-Jun-1980 Patient phone:  [phone] (home)  Patient address:   [address] [place] [address],  Total Time spent with patient: Greater than 30 minutes  Date of Admission:  10/06/2013 Date of Discharge: 10/11/13  Reason for Admission:  Opioid dependence  Discharge Diagnoses: Active Problems:   Opioid dependence   Amphetamine abuse   Bipolar disorder, now depressed   PTSD (post-traumatic stress disorder)   Psychiatric Specialty Exam: Physical Exam  Psychiatric: Her speech is normal and behavior is normal. Judgment and thought content normal. Her mood appears not anxious. Her affect is not angry, not blunt, not labile and not inappropriate. Cognition and memory are normal. She does not exhibit a depressed mood.    Review of Systems  Constitutional: Negative.   HENT: Negative.   Eyes: Negative.   Respiratory: Negative.   Cardiovascular: Negative.   Gastrointestinal: Negative.   Genitourinary: Negative.   Musculoskeletal: Negative.   Skin: Negative.   Neurological: Negative.   Endo/Heme/Allergies: Negative.   Psychiatric/Behavioral: Positive for depression (Stable) and substance abuse (Opioid dependence). Negative for suicidal ideas, hallucinations and memory loss. The patient is not nervous/anxious and does not have insomnia.     Blood pressure 120/83, pulse 79, temperature 97.7 F (36.5 C), temperature source Oral, resp. rate 18, height 5\' 5"  (1.651 m), weight 85.276 kg (188 lb), last menstrual period 09/29/2013, SpO2 100.00%.Body mass index is 31.28 kg/(m^2).   General Appearance: Casual   Eye Contact:: Good   Speech: Normal Rate   Volume: Normal   Mood: Euthymic   Affect: Congruent   Thought Process: Goal Directed   Orientation: Full (Time, Place, and Person)   Thought Content: denies AH/VH   Suicidal Thoughts: No   Homicidal Thoughts: No    Memory: Immediate; Fair  Recent; Fair  Remote; Fair   Judgement: Fair   Insight: Fair   Psychomotor Activity: Normal   Concentration: Fair   Recall: [employer] [place]   Language: Fair   Akathisia: No       Assets: Communication Skills  Desire for Improvement  Intimacy  Social Support  Others: children at home   Sleep: Number of Hours: 6.75    Past Psychiatric History: Diagnosis: Opioid dependence, Bipolar affective disorder, depressed  Hospitalizations: [facility]  Out[facility] Care: [facility]  Substance Abuse Care: Narcotic Anonymous  Self-Mutilation: NA  Suicidal Attempts: NA  Violent Behaviors: NA   Musculoskeletal: Strength & Muscle Tone: within normal limits Gait & Station: normal Patient leans: N/A   Discharge Diagnoses:  DSM -5  Primary Psychiatric diagnosis:  Major depressive disorder,recurrent,moderate   Secondary psychiatric diagnosis:  PTSD  Opioid use disorder  Stimulant use disorder    Non Psychiatric diagnosis: Past Medical History  Diagnosis Date  . Cancer liver  . Depression    Level of Care:  OP  Hospital Course:  33 Y/o female who states she has abused opioids since she was 48. States that few years ago she was diagnosed with hepatocellular carcinoma. She was prescribed opioids for the pain then. So her use got to be heavier.  Kimberleigh was admitted to the hospital due to her problem with drug dependency. She was here for opioid detox. She received clonidine detoxification treatment protocols. She was also medicated and discharged on; Wellbutrin XL 150 mg daily for depression, Hydroxyzine 25 mg as needed for anxiety, Mirtazapine  15 mg for depression/insomnia, Lorazepam 1 mg as needed for anxiety and Paroxetine 10 mg daily for depression. She also was enrolled and participated in the group counseling sessions and AA/NA meetings being offered and held on this unit. She learned coping skills.  Keon has completed detox  treatments and her mood is stable. This is evidenced by her reports of improved mood and absence of substance withdrawal symptoms. She is currently being discharged to continue substance abuse treatment by joining and participating in the narcotic anonymous (NA) meeting being offered and held with her community. Nayanna also has been referred to the [facility] for further psychiatric assistance.  Upon discharge, she adamantly denies any SIHI, AVH, delusional thoughts, paranoia and or withdrawal symptoms. She received from the [facility], a 14 days worth, supply samples of her [facility] discharge medications. She left [facility] with all personal belongings in no apparent distress. Transportation per bus. Bus fare provided by [facility].  Consults:  psychiatry  Significant Diagnostic Studies:  labs: CBC with diff, CMP, UDS, Toxicology tests, U/A  Discharge Vitals:   Blood pressure 120/83, pulse 79, temperature 97.7 F (36.5 C), temperature source Oral, resp. rate 18, height 5\' 5"  (1.651 m), weight 85.276 kg (188 lb), last menstrual period 09/29/2013, SpO2 100.00%. Body mass index is 31.28 kg/(m^2). Lab Results:   No results found for this or any previous visit (from the past 72 hour(s)).  Physical Findings: AIMS: Facial and Oral Movements Muscles of Facial Expression: None, normal Lips and Perioral Area: None, normal Jaw: None, normal Tongue: None, normal,Extremity Movements Upper (arms, wrists, hands, fingers): None, normal Lower (legs, knees, ankles, toes): None, normal, Trunk Movements Neck, shoulders, hips: None, normal, Overall Severity Severity of abnormal movements (highest score from questions above): None, normal Incapacitation due to abnormal movements: None, normal Patient's awareness of abnormal movements (rate only patient's report): No Awareness, Dental Status Current problems with teeth and/or dentures?: No Does patient usually wear dentures?: No  CIWA:    COWS:  COWS Total Score:  2  Psychiatric Specialty Exam: See Psychiatric Specialty Exam and Suicide Risk Assessment completed by Attending Physician prior to discharge.  Discharge destination:  Home  Is patient on multiple antipsychotic therapies at discharge:  No   Has Patient had three or more failed trials of antipsychotic monotherapy by history:  No  Recommended Plan for Multiple Antipsychotic Therapies: NA    Medication List    STOP taking these medications       ALPRAZolam 0.5 MG tablet  Commonly known as:  XANAX     lithium carbonate 450 MG CR tablet  Commonly known as:  ESKALITH      TAKE these medications     Indication   buPROPion 150 MG 24 hr tablet  Commonly known as:  WELLBUTRIN XL  Take 1 tablet (150 mg total) by mouth daily. For depression   Indication:  Major Depressive Disorder     hydrOXYzine 25 MG tablet  Commonly known as:  ATARAX/VISTARIL  Take 1 tablet (25 mg) three times daily as needed: For anxiety   Indication:  Tension, Anxiety     LORazepam 1 MG tablet  Commonly known as:  ATIVAN  Take 1 tablet (1 mg total) by mouth every 4 (four) hours as needed for anxiety.   Indication:  Feeling Anxious     mirtazapine 15 MG tablet  Commonly known as:  REMERON  Take 1 tablet (15 mg total) by mouth at bedtime. For depression/sleep   Indication:  Trouble  Sleeping, Major Depressive Disorder     PARoxetine 10 MG tablet  Commonly known as:  PAXIL  Take 1 tablet (10 mg total) by mouth daily. For depression   Indication:  Major Depressive Disorder       Follow-up Information   Follow up with [facility]. (Referral sent on 10/07/13. Please follow up with facility regarding referral status and bed availability.)    Contact information:   Address: [address] [place], [place] [address]  [phone]      Follow up with Narcotics Anonymous. (Please see provided listing of local meetings for follow up.)      Follow-up recommendations:  Activity:  As tolerated Diet: As recommended  by your primary care doctor. Keep all scheduled follow-up appointments as recommended.  Comments: Take all your medications as prescribed by your mental healthcare provider. Report any adverse effects and or reactions from your medicines to your outpatient provider promptly. Patient is instructed and cautioned to not engage in alcohol and or illegal drug use while on prescription medicines. In the event of worsening symptoms, patient is instructed to call the crisis hotline, 911 and or go to the nearest ED for appropriate evaluation and treatment of symptoms. Follow-up with your primary care provider for your other medical issues, concerns and or health care needs.   Total Discharge Time:  Greater than 30 minutes.  Signed: Encarnacion Slates, [place] 10/11/2013, 1:57 PM

## 2013-10-11 NOTE — Progress Notes (Signed)
Pt d/c from the hospital with a bus pass and bus cash. All items returned. D/C instructions given, prescriptions given and samples given. Pt denies si and hi.

## 2013-10-11 NOTE — Progress Notes (Signed)
[facility] Adult Case Management Discharge Plan :  Will you be returning to the same living situation after discharge: Yes,  patient will be returning home with her husband At discharge, do you have transportation home?:Yes,  patient provided with bus fare and schedule to [place] you have the ability to pay for your medications:Yes,  patient provided with necessary medication samples and prescriptions at discharge.  Release of information consent forms completed and in the chart;  Patient's signature needed at discharge.  Patient to Follow up at: Follow-up Information   Follow up with [facility]. (Referral sent on 10/07/13. Please follow up with facility regarding referral status and bed availability.)    Contact information:   Address: [address] [place], [place] [address]  [phone]      Follow up with Narcotics Anonymous. (Please see provided listing of local meetings for follow up.)       Patient denies SI/HI:   Yes,  denies    Safety Planning and Suicide Prevention discussed:  Yes,  with patient and friend Jeral Fruit.  Erasmo Downer Dr[place]ard, LCSWA (Epic malfunction)

## 2013-10-11 NOTE — Progress Notes (Signed)
Patient ID: Brandy Wise, female   DOB: 04/02/1980, 33 y.o.   MRN: [id number] Late Entry for 10/08/2013 D-Tearful and complains of withdrawal sx of feeling things crawling on her. Sweating, and anxiety. Given Ativan for complaint. Also, reports stomach upset and given prn for complaint. Upset with phone call she had to her lawyer, but outcome of call more positive then not, in that she will be getting probation and no time. Upset she says because she cant believe at her age she has messed things up so badly. A-Support offered. Monitored for safety. Medications as ordered and multiple prns for numerous complaints related to withdrawal and given per COWS. R-Pleasant and cooperative. Attended some of the groups, sat out on one due to complaint of stomach upset, given a heat pack, too early for Zofran which is a prn. Medication.

## 2013-10-11 NOTE — BHH Group Notes (Signed)
[facility] LCSW Aftercare Discharge Planning Group Note   10/11/2013  9:51 AM   Participation Quality: Alert, Appropriate and Oriented  Mood/Affect: Euphoric   Depression Rating: 2  Anxiety Rating: 4  Thoughts of Suicide: Pt denies SI/HI  Will you contract for safety? Yes  Current AVH: Pt denies  Plan for Discharge/Comments: Pt attended discharge planning group and actively participated in group. CSW provided pt with today's workbook. Patient reports that she feels "great" today. She denies current SI/HI. Patient reports that her medication adjustment has been helpful to her. She states that she has been attending AA/NA meetings regularly during hospitalization and plans on attending meetings after discharge. She states that she wishes to be discharged today and plans to return home with her husband and children. She would like to pursue treatment bed from home. CSW updated patient on conversation with P.O. Almyra Brace. Patient reports that her attorney is working on her legal issues. Patient thanked CSW for assistance.  Transportation Means: Pt denies access to transportation.  Supports: No supports mentioned at this time  Brandy Wise, MSW, [place] Worker [facility] [phone]

## 2013-10-11 NOTE — BHH Suicide Risk Assessment (Signed)
Demographic Factors:  Caucasian  Total Time spent with patient: 45 minutes  Psychiatric Specialty Exam: Physical Exam  Constitutional: She is oriented to person, place, and time. She appears well-developed.  HENT:  Head: Normocephalic and atraumatic.  Neck: Normal range of motion. Neck supple.  Musculoskeletal: Normal range of motion.  Neurological: She is alert and oriented to person, place, and time.  Skin: Skin is warm.  Psychiatric: Her speech is normal and behavior is normal. Judgment and thought content normal. Her mood appears not anxious. Cognition and memory are normal.    Review of Systems  Constitutional: Negative.   Eyes: Negative.   Gastrointestinal: Negative.   Genitourinary: Negative.   Musculoskeletal: Negative.   Psychiatric/Behavioral: Negative.  Negative for suicidal ideas and hallucinations.    Blood pressure 120/83, pulse 79, temperature 97.7 F (36.5 C), temperature source Oral, resp. rate 18, height 5\' 5"  (1.651 m), weight 85.276 kg (188 lb), last menstrual period 09/29/2013, SpO2 100.00%.Body mass index is 31.28 kg/(m^2).  General Appearance: Casual  Eye Contact::  Good  Speech:  Normal Rate  Volume:  Normal  Mood:  Euthymic  Affect:  Congruent  Thought Process:  Goal Directed  Orientation:  Full (Time, Place, and Person)  Thought Content:  denies AH/VH  Suicidal Thoughts:  No  Homicidal Thoughts:  No  Memory:  Immediate;   Fair Recent;   Fair Remote;   Fair  Judgement:  Fair  Insight:  Fair  Psychomotor Activity:  Normal  Concentration:  Fair  Recall:  [employer]:Fair  Language: Fair  Akathisia:  No      Assets:  Communication Skills Desire for Improvement Intimacy Social Support Others:  children at home  Sleep:  Number of Hours: 6.75    Musculoskeletal: Strength & Muscle Tone: within normal limits Gait & Station: normal Patient leans: N/A   Mental Status Per Nursing Assessment::   On Admission:      General  Appearance: Disheveled  Eye Contact:: Fair  Speech: Clear and Coherent  Volume: Decreased  Mood: Anxious and Depressed  Affect: Depressed and Tearful  Thought Process: Coherent and Goal Directed  Orientation: Full (Time, Place, and Person)  Thought Content: events symtpoms worries concerns  Suicidal Thoughts: Yes. without intent/plan  Homicidal Thoughts: No  Memory: Immediate; Fair  Recent; Fair  Remote; Fair  Judgement: Fair  Insight: Present  Psychomotor Activity: Restlessness  Concentration: Fair  Recall: [place]  Language: Fair  Akathisia: No  Handed:  AIMS (if indicated):  Assets: Desire for Improvement  Housing  Social Support    Current Mental Status by Physician: Patient appears to be calm ,cooperative,pleasant,denies AH/VH/SI/HI.  Loss Factors: Relational issues  Historical Factors: Impulsivity  Risk Reduction Factors:   Responsible for children under 52 years of age, Sense of responsibility to family, Religious beliefs about death, Positive social support, Positive therapeutic relationship and Positive coping skills or problem solving skills  Continued Clinical Symptoms:  Depression:  Improving  Cognitive Features That Contribute To Risk:  Closed-mindedness Polarized thinking    Suicide Risk:  Minimal: No identifiable suicidal ideation.  Patients presenting with no risk factors but with morbid ruminations; may be classified as minimal risk based on the severity of the depressive symptoms  Discharge Diagnoses: DSM -5 Primary Psychiatric diagnosis: Major depressive disorder,recurrent,moderate   Secondary psychiatric diagnosis: PTSD Opioid use disorder Stimulant use disorder    Non psychiatric diagnosis: Past Medical History  Diagnosis Date  . Cancer liver  .  Depression    Plan Of Care/Follow-up recommendations:  Activity:  no restrictions Diet:  Normal  Is patient on multiple antipsychotic therapies at discharge:  No    Has Patient had three or more failed trials of antipsychotic monotherapy by history:  No  Recommended Plan for Multiple Antipsychotic Therapies: NA    Lilliann Rossetti 10/11/2013, 10:31 AM

## 2013-10-12 NOTE — Clinical Social Work Note (Signed)
CSW received call from [facility] Admissions Coordinator [facility] [phone] stating that patient has a bed available tomorrow Wednesday 8/19. CSW attempted to reach patient at [phone] (husband's cell) and [phone] (home). Awaiting for call back.   Tilden Fossa, MSW, [place] Worker [facility] [phone]

## 2013-10-12 NOTE — Clinical Social Work Note (Signed)
[facility] authorization #[id number] 10/12/13-10/18/13  Tilden Fossa, MSW, [place] Worker [facility] [phone]

## 2013-10-15 NOTE — Progress Notes (Addendum)
Patient Discharge Instructions:  After Visit Summary (AVS):   Faxed to:  10/18/13 Discharge Summary Note:   Faxed to:  10/18/13 Psychiatric Admission Assessment Note:   Faxed to:  10/18/13 Suicide Risk Assessment - Discharge Assessment:   Faxed to:  10/18/13 Faxed/Sent to the Next Level Care provider:  10/18/13 Faxed to Ander Slade @ [phone] No documentation was faxed to [facility] for [facility] on 10/15/13.  No ROI available.  Patsey Berthold, 10/15/2013, 3:58 PM

## 2015-03-30 DIAGNOSIS — C220 Liver cell carcinoma: Secondary | ICD-10-CM

## 2015-03-30 DIAGNOSIS — M1990 Unspecified osteoarthritis, unspecified site: Secondary | ICD-10-CM

## 2015-03-30 DIAGNOSIS — G8929 Other chronic pain: Secondary | ICD-10-CM

## 2016-04-17 DIAGNOSIS — D509 Iron deficiency anemia, unspecified: Secondary | ICD-10-CM | POA: Diagnosis not present

## 2016-04-17 DIAGNOSIS — Z8505 Personal history of malignant neoplasm of liver: Secondary | ICD-10-CM | POA: Diagnosis not present

## 2016-04-17 DIAGNOSIS — E538 Deficiency of other specified B group vitamins: Secondary | ICD-10-CM | POA: Diagnosis not present

## 2018-03-25 DIAGNOSIS — E538 Deficiency of other specified B group vitamins: Secondary | ICD-10-CM

## 2018-03-25 DIAGNOSIS — D709 Neutropenia, unspecified: Secondary | ICD-10-CM

## 2018-03-25 DIAGNOSIS — Z8505 Personal history of malignant neoplasm of liver: Secondary | ICD-10-CM

## 2018-03-25 DIAGNOSIS — L989 Disorder of the skin and subcutaneous tissue, unspecified: Secondary | ICD-10-CM

## 2018-03-25 DIAGNOSIS — Z9221 Personal history of antineoplastic chemotherapy: Secondary | ICD-10-CM

## 2018-03-25 DIAGNOSIS — K30 Functional dyspepsia: Secondary | ICD-10-CM

## 2018-03-25 DIAGNOSIS — D509 Iron deficiency anemia, unspecified: Secondary | ICD-10-CM

## 2018-03-25 DIAGNOSIS — K5909 Other constipation: Secondary | ICD-10-CM

## 2018-03-25 DIAGNOSIS — F418 Other specified anxiety disorders: Secondary | ICD-10-CM

## 2018-03-25 DIAGNOSIS — N200 Calculus of kidney: Secondary | ICD-10-CM
